# Patient Record
Sex: Male | Born: 1945 | Race: Black or African American | Hispanic: No | Marital: Married | Smoking: Former smoker
Health system: Southern US, Community
[De-identification: ages and names within clinical notes are randomized; demographics above are authoritative.]

## PROBLEM LIST (undated history)

## (undated) DIAGNOSIS — I10 Essential (primary) hypertension: Secondary | ICD-10-CM

## (undated) DIAGNOSIS — I5020 Unspecified systolic (congestive) heart failure: Secondary | ICD-10-CM

## (undated) DIAGNOSIS — I2699 Other pulmonary embolism without acute cor pulmonale: Secondary | ICD-10-CM

## (undated) DIAGNOSIS — E1140 Type 2 diabetes mellitus with diabetic neuropathy, unspecified: Secondary | ICD-10-CM

## (undated) DIAGNOSIS — C9140 Hairy cell leukemia not having achieved remission: Secondary | ICD-10-CM

## (undated) DIAGNOSIS — N183 Chronic kidney disease, stage 3 unspecified: Secondary | ICD-10-CM

## (undated) DIAGNOSIS — Z22322 Carrier or suspected carrier of Methicillin resistant Staphylococcus aureus: Secondary | ICD-10-CM

## (undated) DIAGNOSIS — Z9289 Personal history of other medical treatment: Secondary | ICD-10-CM

## (undated) DIAGNOSIS — E785 Hyperlipidemia, unspecified: Secondary | ICD-10-CM

## (undated) DIAGNOSIS — I4729 Other ventricular tachycardia: Secondary | ICD-10-CM

## (undated) DIAGNOSIS — D638 Anemia in other chronic diseases classified elsewhere: Secondary | ICD-10-CM

## (undated) DIAGNOSIS — I472 Ventricular tachycardia: Secondary | ICD-10-CM

## (undated) DIAGNOSIS — R9431 Abnormal electrocardiogram [ECG] [EKG]: Secondary | ICD-10-CM

## (undated) DIAGNOSIS — E118 Type 2 diabetes mellitus with unspecified complications: Secondary | ICD-10-CM

## (undated) DIAGNOSIS — E11319 Type 2 diabetes mellitus with unspecified diabetic retinopathy without macular edema: Secondary | ICD-10-CM

---

## 2011-05-13 DIAGNOSIS — I2699 Other pulmonary embolism without acute cor pulmonale: Secondary | ICD-10-CM

## 2011-05-13 HISTORY — DX: Other pulmonary embolism without acute cor pulmonale: I26.99

## 2018-01-24 ENCOUNTER — Emergency Department (HOSPITAL_COMMUNITY): Payer: Medicare HMO

## 2018-01-24 ENCOUNTER — Inpatient Hospital Stay (HOSPITAL_COMMUNITY): Payer: Medicare HMO

## 2018-01-24 ENCOUNTER — Encounter (HOSPITAL_COMMUNITY): Payer: Self-pay

## 2018-01-24 ENCOUNTER — Inpatient Hospital Stay (HOSPITAL_COMMUNITY)
Admission: EM | Admit: 2018-01-24 | Discharge: 2018-02-09 | DRG: 308 | Disposition: E | Payer: Medicare HMO | Attending: Internal Medicine | Admitting: Internal Medicine

## 2018-01-24 DIAGNOSIS — G4733 Obstructive sleep apnea (adult) (pediatric): Secondary | ICD-10-CM | POA: Diagnosis present

## 2018-01-24 DIAGNOSIS — Z856 Personal history of leukemia: Secondary | ICD-10-CM

## 2018-01-24 DIAGNOSIS — I420 Dilated cardiomyopathy: Secondary | ICD-10-CM | POA: Diagnosis present

## 2018-01-24 DIAGNOSIS — G931 Anoxic brain damage, not elsewhere classified: Secondary | ICD-10-CM | POA: Diagnosis present

## 2018-01-24 DIAGNOSIS — E11319 Type 2 diabetes mellitus with unspecified diabetic retinopathy without macular edema: Secondary | ICD-10-CM | POA: Diagnosis present

## 2018-01-24 DIAGNOSIS — E785 Hyperlipidemia, unspecified: Secondary | ICD-10-CM | POA: Diagnosis present

## 2018-01-24 DIAGNOSIS — I071 Rheumatic tricuspid insufficiency: Secondary | ICD-10-CM | POA: Diagnosis present

## 2018-01-24 DIAGNOSIS — E872 Acidosis: Secondary | ICD-10-CM | POA: Diagnosis present

## 2018-01-24 DIAGNOSIS — Z452 Encounter for adjustment and management of vascular access device: Secondary | ICD-10-CM

## 2018-01-24 DIAGNOSIS — J9600 Acute respiratory failure, unspecified whether with hypoxia or hypercapnia: Secondary | ICD-10-CM

## 2018-01-24 DIAGNOSIS — R34 Anuria and oliguria: Secondary | ICD-10-CM | POA: Diagnosis not present

## 2018-01-24 DIAGNOSIS — R569 Unspecified convulsions: Secondary | ICD-10-CM | POA: Diagnosis present

## 2018-01-24 DIAGNOSIS — I472 Ventricular tachycardia: Secondary | ICD-10-CM | POA: Diagnosis not present

## 2018-01-24 DIAGNOSIS — R402212 Coma scale, best verbal response, none, at arrival to emergency department: Secondary | ICD-10-CM | POA: Diagnosis present

## 2018-01-24 DIAGNOSIS — Z87891 Personal history of nicotine dependence: Secondary | ICD-10-CM

## 2018-01-24 DIAGNOSIS — R570 Cardiogenic shock: Secondary | ICD-10-CM | POA: Diagnosis present

## 2018-01-24 DIAGNOSIS — J9811 Atelectasis: Secondary | ICD-10-CM | POA: Diagnosis present

## 2018-01-24 DIAGNOSIS — Z23 Encounter for immunization: Secondary | ICD-10-CM | POA: Diagnosis present

## 2018-01-24 DIAGNOSIS — Z8614 Personal history of Methicillin resistant Staphylococcus aureus infection: Secondary | ICD-10-CM

## 2018-01-24 DIAGNOSIS — R402312 Coma scale, best motor response, none, at arrival to emergency department: Secondary | ICD-10-CM | POA: Diagnosis present

## 2018-01-24 DIAGNOSIS — I428 Other cardiomyopathies: Secondary | ICD-10-CM

## 2018-01-24 DIAGNOSIS — N179 Acute kidney failure, unspecified: Secondary | ICD-10-CM | POA: Diagnosis present

## 2018-01-24 DIAGNOSIS — R402112 Coma scale, eyes open, never, at arrival to emergency department: Secondary | ICD-10-CM | POA: Diagnosis present

## 2018-01-24 DIAGNOSIS — E1165 Type 2 diabetes mellitus with hyperglycemia: Secondary | ICD-10-CM | POA: Diagnosis present

## 2018-01-24 DIAGNOSIS — Z515 Encounter for palliative care: Secondary | ICD-10-CM | POA: Diagnosis not present

## 2018-01-24 DIAGNOSIS — D638 Anemia in other chronic diseases classified elsewhere: Secondary | ICD-10-CM | POA: Diagnosis present

## 2018-01-24 DIAGNOSIS — Z66 Do not resuscitate: Secondary | ICD-10-CM | POA: Diagnosis not present

## 2018-01-24 DIAGNOSIS — I130 Hypertensive heart and chronic kidney disease with heart failure and stage 1 through stage 4 chronic kidney disease, or unspecified chronic kidney disease: Secondary | ICD-10-CM | POA: Diagnosis present

## 2018-01-24 DIAGNOSIS — D696 Thrombocytopenia, unspecified: Secondary | ICD-10-CM | POA: Diagnosis present

## 2018-01-24 DIAGNOSIS — N183 Chronic kidney disease, stage 3 (moderate): Secondary | ICD-10-CM | POA: Diagnosis present

## 2018-01-24 DIAGNOSIS — Z4659 Encounter for fitting and adjustment of other gastrointestinal appliance and device: Secondary | ICD-10-CM

## 2018-01-24 DIAGNOSIS — I4901 Ventricular fibrillation: Principal | ICD-10-CM | POA: Diagnosis present

## 2018-01-24 DIAGNOSIS — I5022 Chronic systolic (congestive) heart failure: Secondary | ICD-10-CM | POA: Diagnosis present

## 2018-01-24 DIAGNOSIS — J9690 Respiratory failure, unspecified, unspecified whether with hypoxia or hypercapnia: Secondary | ICD-10-CM

## 2018-01-24 DIAGNOSIS — J9601 Acute respiratory failure with hypoxia: Secondary | ICD-10-CM | POA: Diagnosis present

## 2018-01-24 DIAGNOSIS — E1122 Type 2 diabetes mellitus with diabetic chronic kidney disease: Secondary | ICD-10-CM | POA: Diagnosis present

## 2018-01-24 DIAGNOSIS — E875 Hyperkalemia: Secondary | ICD-10-CM | POA: Diagnosis present

## 2018-01-24 DIAGNOSIS — I469 Cardiac arrest, cause unspecified: Secondary | ICD-10-CM

## 2018-01-24 DIAGNOSIS — I361 Nonrheumatic tricuspid (valve) insufficiency: Secondary | ICD-10-CM | POA: Diagnosis not present

## 2018-01-24 DIAGNOSIS — Z86711 Personal history of pulmonary embolism: Secondary | ICD-10-CM

## 2018-01-24 DIAGNOSIS — E1140 Type 2 diabetes mellitus with diabetic neuropathy, unspecified: Secondary | ICD-10-CM | POA: Diagnosis present

## 2018-01-24 HISTORY — DX: Hyperlipidemia, unspecified: E78.5

## 2018-01-24 HISTORY — DX: Type 2 diabetes mellitus with unspecified complications: E11.8

## 2018-01-24 HISTORY — DX: Hairy cell leukemia not having achieved remission: C91.40

## 2018-01-24 HISTORY — DX: Other ventricular tachycardia: I47.29

## 2018-01-24 HISTORY — DX: Carrier or suspected carrier of methicillin resistant Staphylococcus aureus: Z22.322

## 2018-01-24 HISTORY — DX: Essential (primary) hypertension: I10

## 2018-01-24 HISTORY — DX: Anemia in other chronic diseases classified elsewhere: D63.8

## 2018-01-24 HISTORY — DX: Abnormal electrocardiogram (ECG) (EKG): R94.31

## 2018-01-24 HISTORY — DX: Chronic kidney disease, stage 3 (moderate): N18.3

## 2018-01-24 HISTORY — DX: Other pulmonary embolism without acute cor pulmonale: I26.99

## 2018-01-24 HISTORY — DX: Type 2 diabetes mellitus with diabetic neuropathy, unspecified: E11.40

## 2018-01-24 HISTORY — DX: Ventricular tachycardia: I47.2

## 2018-01-24 HISTORY — DX: Type 2 diabetes mellitus with unspecified diabetic retinopathy without macular edema: E11.319

## 2018-01-24 HISTORY — DX: Unspecified systolic (congestive) heart failure: I50.20

## 2018-01-24 HISTORY — DX: Chronic kidney disease, stage 3 unspecified: N18.30

## 2018-01-24 HISTORY — DX: Personal history of other medical treatment: Z92.89

## 2018-01-24 LAB — I-STAT VENOUS BLOOD GAS, ED
Acid-base deficit: 10 mmol/L — ABNORMAL HIGH (ref 0.0–2.0)
Bicarbonate: 17.9 mmol/L — ABNORMAL LOW (ref 20.0–28.0)
O2 Saturation: 54 %
PCO2 VEN: 46.9 mmHg (ref 44.0–60.0)
PH VEN: 7.189 — AB (ref 7.250–7.430)
PO2 VEN: 35 mmHg (ref 32.0–45.0)
TCO2: 19 mmol/L — ABNORMAL LOW (ref 22–32)

## 2018-01-24 LAB — I-STAT CHEM 8, ED
BUN: 44 mg/dL — AB (ref 8–23)
Calcium, Ion: 0.86 mmol/L — CL (ref 1.15–1.40)
Chloride: 111 mmol/L (ref 98–111)
Creatinine, Ser: 1.7 mg/dL — ABNORMAL HIGH (ref 0.61–1.24)
Glucose, Bld: 109 mg/dL — ABNORMAL HIGH (ref 70–99)
HEMATOCRIT: 31 % — AB (ref 39.0–52.0)
HEMOGLOBIN: 10.5 g/dL — AB (ref 13.0–17.0)
Potassium: 3.7 mmol/L (ref 3.5–5.1)
SODIUM: 140 mmol/L (ref 135–145)
TCO2: 19 mmol/L — AB (ref 22–32)

## 2018-01-24 LAB — POCT I-STAT, CHEM 8
BUN: 40 mg/dL — AB (ref 8–23)
BUN: 40 mg/dL — ABNORMAL HIGH (ref 8–23)
CALCIUM ION: 1.05 mmol/L — AB (ref 1.15–1.40)
CHLORIDE: 105 mmol/L (ref 98–111)
CHLORIDE: 104 mmol/L (ref 98–111)
CREATININE: 2.3 mg/dL — AB (ref 0.61–1.24)
CREATININE: 2.4 mg/dL — AB (ref 0.61–1.24)
Calcium, Ion: 1.06 mmol/L — ABNORMAL LOW (ref 1.15–1.40)
GLUCOSE: 210 mg/dL — AB (ref 70–99)
GLUCOSE: 209 mg/dL — AB (ref 70–99)
HCT: 36 % — ABNORMAL LOW (ref 39.0–52.0)
HCT: 34 % — ABNORMAL LOW (ref 39.0–52.0)
Hemoglobin: 12.2 g/dL — ABNORMAL LOW (ref 13.0–17.0)
Hemoglobin: 11.6 g/dL — ABNORMAL LOW (ref 13.0–17.0)
POTASSIUM: 3.9 mmol/L (ref 3.5–5.1)
Potassium: 3 mmol/L — ABNORMAL LOW (ref 3.5–5.1)
Sodium: 140 mmol/L (ref 135–145)
Sodium: 140 mmol/L (ref 135–145)
TCO2: 21 mmol/L — ABNORMAL LOW (ref 22–32)
TCO2: 21 mmol/L — ABNORMAL LOW (ref 22–32)

## 2018-01-24 LAB — COMPREHENSIVE METABOLIC PANEL
ALK PHOS: 135 U/L — AB (ref 38–126)
ALT: 17 U/L (ref 0–44)
AST: 26 U/L (ref 15–41)
Albumin: 2 g/dL — ABNORMAL LOW (ref 3.5–5.0)
Anion gap: 14 (ref 5–15)
BUN: 35 mg/dL — AB (ref 8–23)
CALCIUM: 6.7 mg/dL — AB (ref 8.9–10.3)
CHLORIDE: 111 mmol/L (ref 98–111)
CO2: 15 mmol/L — AB (ref 22–32)
CREATININE: 1.69 mg/dL — AB (ref 0.61–1.24)
GFR calc non Af Amer: 39 mL/min — ABNORMAL LOW (ref >60)
GFR, EST AFRICAN AMERICAN: 45 mL/min — AB (ref >60)
Glucose, Bld: 110 mg/dL — ABNORMAL HIGH (ref 70–99)
Potassium: 3.8 mmol/L (ref 3.5–5.1)
SODIUM: 140 mmol/L (ref 135–145)
Total Bilirubin: 1.1 mg/dL (ref 0.3–1.2)
Total Protein: 5.5 g/dL — ABNORMAL LOW (ref 6.5–8.1)

## 2018-01-24 LAB — TROPONIN I
TROPONIN I: 0.29 ng/mL — AB (ref <0.03)
Troponin I: 0.03 ng/mL (ref <0.03)

## 2018-01-24 LAB — CBC WITH DIFFERENTIAL/PLATELET
BASOS PCT: 0 %
Basophils Absolute: 0 10*3/uL (ref 0.0–0.1)
Eosinophils Absolute: 0 10*3/uL (ref 0.0–0.7)
Eosinophils Relative: 0 %
HCT: 36.6 % — ABNORMAL LOW (ref 39.0–52.0)
HEMOGLOBIN: 11.1 g/dL — AB (ref 13.0–17.0)
LYMPHS PCT: 81 %
Lymphs Abs: 5.7 10*3/uL — ABNORMAL HIGH (ref 0.7–4.0)
MCH: 25.7 pg — ABNORMAL LOW (ref 26.0–34.0)
MCHC: 30.3 g/dL (ref 30.0–36.0)
MCV: 84.7 fL (ref 78.0–100.0)
MONOS PCT: 3 %
Monocytes Absolute: 0.2 10*3/uL (ref 0.1–1.0)
NEUTROS PCT: 16 %
Neutro Abs: 1.1 10*3/uL — ABNORMAL LOW (ref 1.7–7.7)
Platelets: 131 10*3/uL — ABNORMAL LOW (ref 150–400)
RBC: 4.32 MIL/uL (ref 4.22–5.81)
RDW: 16.6 % — ABNORMAL HIGH (ref 11.5–15.5)
WBC: 7 10*3/uL (ref 4.0–10.5)

## 2018-01-24 LAB — POCT I-STAT 3, ART BLOOD GAS (G3+)
ACID-BASE DEFICIT: 4 mmol/L — AB (ref 0.0–2.0)
Bicarbonate: 19.7 mmol/L — ABNORMAL LOW (ref 20.0–28.0)
O2 Saturation: 100 %
PH ART: 7.482 — AB (ref 7.350–7.450)
TCO2: 21 mmol/L — AB (ref 22–32)
pCO2 arterial: 25.2 mmHg — ABNORMAL LOW (ref 32.0–48.0)
pO2, Arterial: 169 mmHg — ABNORMAL HIGH (ref 83.0–108.0)

## 2018-01-24 LAB — I-STAT ARTERIAL BLOOD GAS, ED
Acid-base deficit: 2 mmol/L (ref 0.0–2.0)
Bicarbonate: 23.1 mmol/L (ref 20.0–28.0)
O2 SAT: 100 %
PCO2 ART: 40.1 mmHg (ref 32.0–48.0)
PO2 ART: 527 mmHg — AB (ref 83.0–108.0)
Patient temperature: 97.4
TCO2: 24 mmol/L (ref 22–32)
pH, Arterial: 7.364 (ref 7.350–7.450)

## 2018-01-24 LAB — RAPID URINE DRUG SCREEN, HOSP PERFORMED
AMPHETAMINES: NOT DETECTED
BARBITURATES: NOT DETECTED
BENZODIAZEPINES: NOT DETECTED
COCAINE: NOT DETECTED
Opiates: NOT DETECTED
Tetrahydrocannabinol: NOT DETECTED

## 2018-01-24 LAB — LIPID PANEL
CHOL/HDL RATIO: 3.1 ratio
CHOLESTEROL: 107 mg/dL (ref 0–200)
HDL: 34 mg/dL — ABNORMAL LOW (ref >40)
LDL Cholesterol: 59 mg/dL (ref 0–99)
Triglycerides: 72 mg/dL (ref <150)
VLDL: 14 mg/dL (ref 0–40)

## 2018-01-24 LAB — PROTIME-INR
INR: 1.45
INR: 1.54
PROTHROMBIN TIME: 18.4 s — AB (ref 11.4–15.2)
Prothrombin Time: 17.5 seconds — ABNORMAL HIGH (ref 11.4–15.2)

## 2018-01-24 LAB — CBG MONITORING, ED: Glucose-Capillary: 96 mg/dL (ref 70–99)

## 2018-01-24 LAB — I-STAT CG4 LACTIC ACID, ED
LACTIC ACID, VENOUS: 3.99 mmol/L — AB (ref 0.5–1.9)
Lactic Acid, Venous: 4.37 mmol/L (ref 0.5–1.9)

## 2018-01-24 LAB — I-STAT TROPONIN, ED: TROPONIN I, POC: 0.01 ng/mL (ref 0.00–0.08)

## 2018-01-24 LAB — GLUCOSE, CAPILLARY
GLUCOSE-CAPILLARY: 188 mg/dL — AB (ref 70–99)
GLUCOSE-CAPILLARY: 191 mg/dL — AB (ref 70–99)
GLUCOSE-CAPILLARY: 213 mg/dL — AB (ref 70–99)
Glucose-Capillary: 200 mg/dL — ABNORMAL HIGH (ref 70–99)
Glucose-Capillary: 201 mg/dL — ABNORMAL HIGH (ref 70–99)

## 2018-01-24 LAB — MAGNESIUM: MAGNESIUM: 1.7 mg/dL (ref 1.7–2.4)

## 2018-01-24 LAB — APTT: APTT: 27 s (ref 24–36)

## 2018-01-24 LAB — ETHANOL: Alcohol, Ethyl (B): <10 mg/dL (ref <10)

## 2018-01-24 MED ORDER — ASPIRIN 300 MG RE SUPP: 300.0000 mg | RECTAL | Status: AC

## 2018-01-24 MED ORDER — MIDAZOLAM HCL 2 MG/2ML IJ SOLN: 1.0000 mg | Freq: Once | INTRAMUSCULAR | Status: DC

## 2018-01-24 MED ORDER — INSULIN ASPART 100 UNIT/ML ~~LOC~~ SOLN
0.0000 [IU] | SUBCUTANEOUS | Status: DC
  Administered 2018-01-24: 2 [IU] via SUBCUTANEOUS

## 2018-01-24 MED ORDER — AMIODARONE HCL IN DEXTROSE 360-4.14 MG/200ML-% IV SOLN
30.0000 mg/h | INTRAVENOUS | Status: DC
  Administered 2018-01-24 – 2018-01-26 (×4): 30 mg/h via INTRAVENOUS
  Filled 2018-01-24 (×3): qty 200

## 2018-01-24 MED ORDER — ARTIFICIAL TEARS OPHTHALMIC OINT
1.0000 "application " | TOPICAL_OINTMENT | Freq: Three times a day (TID) | OPHTHALMIC | Status: DC
  Administered 2018-01-24 – 2018-01-28 (×9): 1 via OPHTHALMIC
  Filled 2018-01-24: qty 3.5

## 2018-01-24 MED ORDER — CHLORHEXIDINE GLUCONATE 0.12% ORAL RINSE (MEDLINE KIT)
15.0000 mL | Freq: Two times a day (BID) | OROMUCOSAL | Status: DC
  Administered 2018-01-24 – 2018-01-31 (×15): 15 mL via OROMUCOSAL

## 2018-01-24 MED ORDER — SODIUM CHLORIDE 0.9 % IV SOLN: INTRAVENOUS | Status: DC | PRN

## 2018-01-24 MED ORDER — FENTANYL 2500MCG IN NS 250ML (10MCG/ML) PREMIX INFUSION
100.0000 ug/h | INTRAVENOUS | Status: DC
  Administered 2018-01-24: 100 ug/h via INTRAVENOUS
  Administered 2018-01-25 – 2018-01-26 (×3): 250 ug/h via INTRAVENOUS
  Administered 2018-01-26: 150 ug/h via INTRAVENOUS
  Administered 2018-01-27: 100 ug/h via INTRAVENOUS
  Filled 2018-01-24 (×7): qty 250

## 2018-01-24 MED ORDER — AMIODARONE LOAD VIA INFUSION
150.0000 mg | Freq: Once | INTRAVENOUS | Status: AC
  Administered 2018-01-24: 150 mg via INTRAVENOUS
  Filled 2018-01-24: qty 83.34

## 2018-01-24 MED ORDER — EPINEPHRINE PF 1 MG/ML IJ SOLN
0.5000 ug/min | INTRAVENOUS | Status: DC
  Administered 2018-01-24: 0.5 ug/min via INTRAVENOUS
  Filled 2018-01-24: qty 4

## 2018-01-24 MED ORDER — NOREPINEPHRINE 4 MG/250ML-% IV SOLN: 0.0000 ug/min | INTRAVENOUS | Status: DC

## 2018-01-24 MED ORDER — ORAL CARE MOUTH RINSE
15.0000 mL | OROMUCOSAL | Status: DC
  Administered 2018-01-24 – 2018-02-01 (×73): 15 mL via OROMUCOSAL

## 2018-01-24 MED ORDER — POTASSIUM CHLORIDE 10 MEQ/100ML IV SOLN
10.0000 meq | INTRAVENOUS | Status: AC
  Administered 2018-01-24 – 2018-01-25 (×4): 10 meq via INTRAVENOUS
  Filled 2018-01-24 (×4): qty 100

## 2018-01-24 MED ORDER — SODIUM CHLORIDE 0.9 % IV SOLN
INTRAVENOUS | Status: DC
  Administered 2018-01-24 – 2018-01-29 (×4): via INTRAVENOUS

## 2018-01-24 MED ORDER — SODIUM CHLORIDE 0.9 % IV SOLN
1.0000 ug/kg/min | INTRAVENOUS | Status: DC
  Administered 2018-01-24 – 2018-01-25 (×2): 1 ug/kg/min via INTRAVENOUS
  Filled 2018-01-24 (×2): qty 20

## 2018-01-24 MED ORDER — FAMOTIDINE 40 MG/5ML PO SUSR
20.0000 mg | Freq: Two times a day (BID) | ORAL | Status: DC
  Administered 2018-01-24 – 2018-01-26 (×4): 20 mg
  Filled 2018-01-24 (×5): qty 2.5

## 2018-01-24 MED ORDER — FENTANYL CITRATE (PF) 100 MCG/2ML IJ SOLN: 50.0000 ug | Freq: Once | INTRAMUSCULAR | Status: DC

## 2018-01-24 MED ORDER — SODIUM BICARBONATE 8.4 % IV SOLN
INTRAVENOUS | Status: AC | PRN
  Administered 2018-01-24: 50 meq via INTRAVENOUS

## 2018-01-24 MED ORDER — HEPARIN SODIUM (PORCINE) 5000 UNIT/ML IJ SOLN
5000.0000 [IU] | Freq: Three times a day (TID) | INTRAMUSCULAR | Status: DC
  Administered 2018-01-25 – 2018-02-01 (×22): 5000 [IU] via SUBCUTANEOUS
  Filled 2018-01-24 (×22): qty 1

## 2018-01-24 MED ORDER — CISATRACURIUM BOLUS VIA INFUSION
0.0500 mg/kg | INTRAVENOUS | Status: DC | PRN
  Filled 2018-01-24: qty 5

## 2018-01-24 MED ORDER — FENTANYL BOLUS VIA INFUSION
25.0000 ug | INTRAVENOUS | Status: DC | PRN
  Filled 2018-01-24: qty 25

## 2018-01-24 MED ORDER — CISATRACURIUM BOLUS VIA INFUSION
0.1000 mg/kg | Freq: Once | INTRAVENOUS | Status: AC
  Administered 2018-01-24: 9.2 mg via INTRAVENOUS
  Filled 2018-01-24: qty 10

## 2018-01-24 MED ORDER — MAGNESIUM SULFATE IN D5W 1-5 GM/100ML-% IV SOLN
1.0000 g | Freq: Once | INTRAVENOUS | Status: AC
  Administered 2018-01-24: 1 g via INTRAVENOUS
  Filled 2018-01-24: qty 100

## 2018-01-24 MED ORDER — SODIUM CHLORIDE 0.9 % IV SOLN
INTRAVENOUS | Status: DC
  Administered 2018-01-24: 1.5 [IU]/h via INTRAVENOUS
  Filled 2018-01-24 (×3): qty 1

## 2018-01-24 MED ORDER — SODIUM CHLORIDE 0.9 % IV SOLN
INTRAVENOUS | Status: DC
  Administered 2018-01-24: 100 mL/h via INTRAVENOUS
  Administered 2018-01-25 – 2018-01-26 (×2): via INTRAVENOUS

## 2018-01-24 MED ORDER — MIDAZOLAM BOLUS VIA INFUSION
1.0000 mg | INTRAVENOUS | Status: DC | PRN
  Filled 2018-01-24 (×2): qty 1

## 2018-01-24 MED ORDER — SODIUM CHLORIDE 0.9 % IV SOLN
2.0000 mg/h | INTRAVENOUS | Status: DC
  Administered 2018-01-24: 2 mg/h via INTRAVENOUS
  Administered 2018-01-25 (×2): 3 mg/h via INTRAVENOUS
  Administered 2018-01-26 – 2018-01-27 (×3): 10 mg/h via INTRAVENOUS
  Filled 2018-01-24 (×6): qty 10

## 2018-01-24 MED ORDER — AMIODARONE HCL IN DEXTROSE 360-4.14 MG/200ML-% IV SOLN
60.0000 mg/h | INTRAVENOUS | Status: AC
  Administered 2018-01-24 (×2): 60 mg/h via INTRAVENOUS
  Filled 2018-01-24 (×2): qty 200

## 2018-01-24 MED ORDER — EPINEPHRINE PF 1 MG/10ML IJ SOSY
PREFILLED_SYRINGE | INTRAMUSCULAR | Status: AC | PRN
  Administered 2018-01-24: 1 mg via INTRAVENOUS

## 2018-01-24 NOTE — Progress Notes (Signed)
   01/15/2018 1600  Clinical Encounter Type  Visited With Family  Visit Type Initial;ED;Critical Care;Psychological support;Spiritual support  Referral From Nurse  Spiritual Encounters  Spiritual Needs Prayer;Emotional  [place] paged to support family; emotional and spiritual support offered along with prayer; family currently in [place]; [place] will escort family to [place] when patient transfers

## 2018-01-24 NOTE — H&P (Signed)
PULMONARY / CRITICAL CARE MEDICINE   NAME:  Andrew Lewis, MRN:  [id number], DOB:  1946-03-11, LOS: 0 ADMISSION DATE:  02/03/2018, CONSULTATION DATE:  02/07/2018 REFERRING MD:  A. Zenia Resides (ED) CHIEF COMPLAINT:  S/P V-fib arrest  HISTORY OF PRESENT ILLNESS   Andrew Lewis is a 72 y.o. B M brought to [facility] ED following a V-fib arrest. Per the patient's wife, the were en route back to their home in [place], [place] from [place] and stopped at [employer] in [place]. After eating and getting back into their car, the patient suddenly became unresponsive without a prior complaint of chest pain, SOB or noticeable lateralizing signs or symptoms. EMS was called, arrived within 15 min and found the patient apneic and pulseless in V-fib arrest. He received shock x 3, epi x 2 and arrived on Epi drip; Amiodarone 300mg  was also given prior to arrival. He reportedly had intermittent pulses during the 30 min transport to [facility] with approximate down time to initial ROSC of 7 min. ECG post arrival without evidence of STEMI and TnI was 0.03.  The wife states that the patient has a hx HTN, DM and an arrhythmia in the past, for which he was on anticoagulants. He does see a cardiologist in [place]. He also has a hx OSA and uses CPAP SIGNIFICANT PAST MEDICAL HISTORY   Hx of arrhythmia No hx seizures or syncope  STUDIES:   9/15 PCXR - cardiomegaly with a right pleural effusion 9/15 Head CT - pending CULTURES:  None  ANTIBIOTICS:  None  LINES/TUBES:   R&L periph IV OGT ETT Foley CONSULTANTS:  Seen by Cardiol in ED SUBJECTIVE:  Unresponsive  CONSTITUTIONAL: BP 122/84   Pulse (!) 119   Temp (!) 97.3 F (36.3 C)   Resp 17   Ht 5\' 9"  (1.753 m)   Wt 90.7 kg   SpO2 (!) 77%   BMI 29.53 kg/m   No intake/output data recorded.        PHYSICAL EXAM: General:  WD/WN BM intubated on vent Neuro: Sluggishly reactive pupils; +corneals; +gag; no response with plantar stimulation HEENT:  [place]/AT; fundi not well  visualized; TMs clear; ETT/OGT in place Cardiovascular:  Unable to hear heart sounds secondary to lung noises Lungs:  Bilateral rhonchi Abdomen:  Supple, no guarding; no discrete organomegaly, +BS Musculoskeletal:  No active joints Skin:  Tr edema  RESOLVED PROBLEM LIST   ASSESSMENT AND PLAN   1) S/P V-fib arrest with down time <45 min. No evidence AMI. Will implement TTM after head CT. 2) Hx OSA. No acute intervention required 3) HTN. Not a current issue as patient currently on pressors 4) Type II DM. SSI 5) R pleural effusion with cardiomegaly. Likely chronic OHD with ventricular dysfn. Will obtain ECHO. 6) Probable CKD with increased creat. Follow urine output, trend BMD and avoid nephrotoxic agents    Best Practice / Goals of Care / Disposition.   DVT PROPHYLAXIS:LMWH NUTRITION:NPO MOBILITY:BR GOALS OF CARE:TTM FAMILY DISCUSSIONS: D[place] in ED   LABS  Glucose Recent Labs  Lab 01/14/2018 1445  GLUCAP 96    BMET Recent Labs  Lab 01/25/2018 1447 01/16/2018 1457  NA 140 140  K 3.7 3.8  CL 111 111  CO2  --  15*  BUN 44* 35*  CREATININE 1.70* 1.69*  GLUCOSE 109* 110*    Liver Enzymes Recent Labs  Lab 02/02/2018 1457  AST 26  ALT 17  ALKPHOS 135*  BILITOT 1.1  ALBUMIN 2.0*    Electrolytes Recent Labs  Lab 01/30/2018 1457  CALCIUM 6.7*    CBC Recent Labs  Lab 01/10/2018 1447 01/13/2018 1457  WBC  --  7.0  HGB 10.5* 11.1*  HCT 31.0* 36.6*  PLT  --  131*    ABG No results for input(s): PHART, PCO2ART, PO2ART in the last 168 hours.  Coag's Recent Labs  Lab 02/03/2018 1505  APTT 27  INR 1.45    Sepsis Markers Recent Labs  Lab 01/29/2018 1455  LATICACIDVEN 4.37*    Cardiac Enzymes Recent Labs  Lab 02/06/2018 1457  TROPONINI 0.03*    PAST MEDICAL HISTORY :   He  has a past medical history of Diabetes mellitus without complication ([place]).  PAST SURGICAL HISTORY:  He  has no past surgical history on file.  Allergies  Allergen Reactions  .  Tape Other (See Comments)    Pt has keloid-pulls skinn off leaving bad scar    No current facility-administered medications on file prior to encounter.    No current outpatient medications on file prior to encounter.    FAMILY HISTORY:   His family history is not on file.  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

## 2018-01-24 NOTE — ED Provider Notes (Signed)
[facility] EMERGENCY DEPARTMENT Provider Note   CSN: [id number] Arrival date & time: 01/29/2018  1433     History   Chief Complaint No chief complaint on file.   HPI Arthor Gorter is a 72 y.o. male.  72 year old male presents via EMS after experience in cardiac arrest.  Patient was found unresponsive in a restaurant and EMS was called and he was in V. fib.  Patient ACLS protocol started patient and he had had intermittent return of spontaneous circulation.  Patient had been defibrillated as well as given amiodarone as well as placed on epinephrine drip.  He was intubated with a Combitube.  Patient lost pulses again just prior to arrival and presents on the external bumper     Past Medical History:  Diagnosis Date  . Diabetes mellitus without complication ([place])     There are no active problems to display for this patient.         Home Medications    Prior to Admission medications   Not on File    Family History No family history on file.  Social History Social History   Tobacco Use  . Smoking status: Not on file  Substance Use Topics  . Alcohol use: Not on file  . Drug use: Not on file     Allergies   Patient has no allergy information on record.   Review of Systems Review of Systems  Unable to perform ROS: Intubated     Physical Exam Updated Vital Signs BP (!) 152/106   Pulse (!) 119   Temp (!) 96.1 F (35.6 C) (Temporal)   Resp 14   Ht 1.753 m (5\' 9" )   Wt 90.7 kg   SpO2 (!) 77%   BMI 29.53 kg/m   Physical Exam  Constitutional: He appears well-developed and well-nourished. He has a sickly appearance. He is intubated.  HENT:  Head: Normocephalic and atraumatic.  Eyes: Pupils are equal, round, and reactive to light. Conjunctivae, EOM and lids are normal.  Neck: Normal range of motion. Neck supple. No tracheal deviation present. No thyroid mass present.  Cardiovascular: Normal rate, regular rhythm and normal heart  sounds. Exam reveals no gallop.  No murmur heard. Pulmonary/Chest: He is intubated. He has decreased breath sounds. He has rhonchi.  Abdominal: Soft. Normal appearance and bowel sounds are normal. He exhibits no distension. There is no tenderness. There is no rebound and no CVA tenderness.  Musculoskeletal: Normal range of motion. He exhibits no edema or tenderness.  Neurological: He is unresponsive. GCS eye subscore is 1. GCS verbal subscore is 1. GCS motor subscore is 1.  Skin: Skin is warm and dry. No abrasion and no rash noted.  Nursing note and vitals reviewed.    ED Treatments / Results  Labs (all labs ordered are listed, but only abnormal results are displayed) Labs Reviewed  I-STAT CHEM 8, ED - Abnormal; Notable for the following components:      Result Value   BUN 44 (*)    Creatinine, Ser 1.70 (*)    Glucose, Bld 109 (*)    Calcium, Ion 0.86 (*)    TCO2 19 (*)    Hemoglobin 10.5 (*)    HCT 31.0 (*)    All other components within normal limits  I-STAT CG4 LACTIC ACID, ED - Abnormal; Notable for the following components:   Lactic Acid, Venous 4.37 (*)    All other components within normal limits  I-STAT VENOUS BLOOD GAS, ED - Abnormal;  Notable for the following components:   pH, Ven 7.189 (*)    Bicarbonate 17.9 (*)    TCO2 19 (*)    Acid-base deficit 10.0 (*)    All other components within normal limits  CBC WITH DIFFERENTIAL/PLATELET  PROTIME-INR  APTT  COMPREHENSIVE METABOLIC PANEL  TROPONIN I  LIPID PANEL  RAPID URINE DRUG SCREEN, HOSP PERFORMED  ETHANOL  I-STAT TROPONIN, ED  CBG MONITORING, ED  I-STAT ARTERIAL BLOOD GAS, ED    EKG None  Radiology No results found.  Procedures Procedure Name: Intubation Date/Time: 02/07/2018 3:04 PM Performed by: Lacretia Leigh, MD Pre-anesthesia Checklist: Patient identified Preoxygenation: Pre-oxygenation with 100% oxygen Ventilation: Mask ventilation without difficulty Laryngoscope Size: Glidescope and  1 Grade View: Grade I Tube size: 7.5 mm Number of attempts: 1 Airway Equipment and Method: Video-laryngoscopy Placement Confirmation: ETT inserted through vocal cords under direct vision and Positive ETCO2 Secured at: 24 cm Tube secured with: ETT holder      (including critical care time)  Medications Ordered in ED Medications  0.9 %  sodium chloride infusion (has no administration in time range)  EPINEPHrine (ADRENALIN) 4 mg in dextrose 5 % 250 mL (0.016 mg/mL) infusion (has no administration in time range)  EPINEPHrine (ADRENALIN) 1 MG/10ML injection (1 mg Intravenous Given 01/23/2018 1442)  sodium bicarbonate injection (50 mEq Intravenous Given 01/18/2018 1443)     Initial Impression / Assessment and Plan / ED Course  I have reviewed the triage vital signs and the nursing notes.  Pertinent labs & imaging results that were available during my care of the patient were reviewed by me and considered in my medical decision making (see chart for details).     Patient is ET tube changed here and placement has been confirmed.  Spoke with Dr. Harrell Gave from cardiology who has seen the patient.  EKG shows that patient may be in heart block.  Patient lost pulses transiently but were regained when patient given bicarbonate as well as epinephrine and restarted on his epinephrine drip.  Discussed with critical care will come and admit the patient.   CRITICAL CARE Performed by: Leota Jacobsen Total critical care time: 45 minutes Critical care time was exclusive of separately billable procedures and treating other patients. Critical care was necessary to treat or prevent imminent or life-threatening deterioration. Critical care was time spent personally by me on the following activities: development of treatment plan with patient and/or surrogate as well as nursing, discussions with consultants, evaluation of patient's response to treatment, examination of patient, obtaining history from  patient or surrogate, ordering and performing treatments and interventions, ordering and review of laboratory studies, ordering and review of radiographic studies, pulse oximetry and re-evaluation of patient's condition.   Final Clinical Impressions(s) / ED Diagnoses   Final diagnoses:  None    ED Discharge Orders    None       Lacretia Leigh, MD 01/14/2018 1511

## 2018-01-24 NOTE — Consult Note (Signed)
Cardiology Consultation:   Patient ID: [name]; [id number]; 1945-09-27   Admit date: 01/27/2018 Date of Consult: 01/30/2018  Primary Care Provider: System, Pcp Not In Primary Cardiologist: Dr. Ashby Dawes, MD [facility])   Patient Profile:   Andrew Lewis is a 72 y.o. 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 Zenia Resides.  History of Present Illness:   Mr. Carignan has been followed by [facility], being seen recently on 01/04/2018. Most recent ischemic evaluation available for review by nuclear stress testing in 2011 negative for ischemia. Prior echo in 2014 showed an EF of 20-25%, dilated LV and RV, severely reduced RVSF, severe biatrial enlargement, moderate MR, severe TR, mild AI, moderately elevated PASP. Echo on 01/21/2018 showed an EF of 10-15%, mild concentric LVH, Gr3DD, moderately dilated LV cavity, mildly enlarged RV with mildly reduced RVSF, severe biatrial enlargement, mild mitral annular calcification, moderate to severe MR/TR, moderately elevated PASP. He has been maintained on lisinopril, Lopressor, ASA, and Crestor.   Patient and family were en route from [place] back to [place], [place] when they stopped at [employer] to eat. Upon finishing eating and getting back to the car, the patient was found unresponsive by family member while sitting in his car, slumped over the steering wheel. EMS was called (arrived within 15 minutes) and he was placed on the monitor where VF was noted. He  received defibrillation x 3, EPi x 2, amiodarone 300 mg bolus, and arrived on an Epi gtt. King tube was placed in the field and has been been changed for a ET tube. Pulses were intermittent during transport. Approximate down time to ROSC was 7 minutes per notes. Initial I-stat troponin of 0.01. K+ 3.7, SCr 1.70, HGB 10.5, albumin 2.0, Alk phos 135, TnI 0.03, UDS negative, ethanol < 10, CXR with right pleural effusion with basilar atelectasis. EKG with high-grade AV block, 65 bpm, left axis deviation, prior inferior infarct, lateral TWI (possibly old when looking at EKG read out from 01/04/2018 in [place]), no STEMI. He has been placed on hypothermic protocol in the ED. In the ED, he transiently lost pulses again, though regained pulse with sodium bicarb administration. Not currently on sedation. SBP in the 140s mmHg on Epi gtt.   Past Medical History:  Diagnosis Date  . Anemia of chronic disease   . Chronic kidney disease (CKD), stage III (moderate) ([facility])   . Diabetes mellitus with complication ([place])   . Diabetic neuropathy ([place])   . Diabetic retinopathy ([place])   . Hairy cell leukemia ([place])   . HFrEF (heart failure with reduced ejection fraction) ([place])    a. NICM with an EF of EF of 10-15%, mild concentric LVH, Gr3DD, moderately dilated LV cavity, mildly enlarged RV with mildly reduced RVSF, severe biatrial enlargement, mild mitral annular calcification, moderate to severe MR/TR, moderately elevated PASP; b. previously declined ICD  . History of nuclear stress test    a. 2011: negative for ischemia  . HLD (hyperlipidemia)   .  HTN (hypertension)   . MRSA (methicillin resistant staph aureus) culture positive   . NSVT (nonsustained ventricular tachycardia) ([place])   . Prolonged Q-T interval on ECG   . Pulmonary embolism ([place]) 2013   a. tx'd w/ Coumadin     Home Meds: Prior to Admission medications   Not on File    Inpatient Medications: Scheduled Meds:  Continuous Infusions: .  sodium chloride    . sodium chloride    . epinephrine 0.5 mcg/min (01/22/2018 1503)   PRN Meds: Place/Maintain arterial line **AND** sodium chloride  Allergies:   Allergies  Allergen Reactions  . Tape Other (See Comments)    Pt has keloid-pulls skinn off leaving bad scar    Social History:   Social History   Socioeconomic History  . Marital status: Married    Spouse name: Not on file  . Number of children: Not on file  . Years of education: Not on file  . Highest education level: Not on file  Occupational History  . Not on file  Social Needs  . Financial resource strain: Not on file  . Food insecurity:    Worry: Not on file    Inability: Not on file  . Transportation needs:    Medical: Not on file    Non-medical: Not on file  Tobacco Use  . Smoking status: Not on file  Substance and Sexual Activity  . Alcohol use: Not on file  . Drug use: Not on file  . Sexual activity: Not on file  Lifestyle  . Physical activity:    Days per week: Not on file    Minutes per session: Not on file  . Stress: Not on file  Relationships  . Social connections:    Talks on phone: Not on file    Gets together: Not on file    Attends religious service: Not on file    Active member of club or organization: Not on file    Attends meetings of clubs or organizations: Not on file    Relationship status: Not on file  . Intimate partner violence:    Fear of current or ex partner: Not on file    Emotionally abused: Not on file    Physically abused: Not on file    Forced sexual activity: Not on file  Other Topics Concern  . Not on file  Social History Narrative  . Not on file     Family History: No family history on file.  Unable to obtain; patient intubated  ROS:  Review of Systems  Unable to perform ROS: Intubated      Physical Exam/Data:   Vitals:   01/19/2018 1530 01/23/2018 1622 01/28/2018 1628 01/19/2018 1631  BP: 122/84 133/84 (!) 133/93   Pulse:      Resp: 17 15 16    Temp:  (!) 97.3 F (36.3 C) (!) 96.8 F (36 C) (!) 96.6 F (35.9 C)   TempSrc:      SpO2:      Weight:    91.8 kg  Height:       No intake or output data in the 24 hours ending 01/31/2018 1634 Filed Weights   01/18/2018 1455 01/13/2018 1631  Weight: 90.7 kg 91.8 kg   Body mass index is 29.87 kg/m.   Physical Exam: General: Critically ill appearing. Head: Normocephalic, atraumatic, sclera non-icteric, no xanthomas, nares without discharge.  Neck: Negative for carotid bruits. JVD difficult to assess given mechanical airway. Lungs: Diminished breath sounds bilaterally. Intubated.  Heart: RRR with S1 S2. III/VI systolic murmurs RUSB, II/VI systolic murmur at the apex, no rubs, or gallops appreciated. Abdomen: Soft, non-tender, non-distended with normoactive bowel sounds. No hepatomegaly. No rebound/guarding. No obvious abdominal masses. Msk:  Strength and tone appear normal for age. Extremities: No clubbing or cyanosis. 1+ bilateral edema with a chronic woody appearance.  Neuro: Intubated. Psych:  Intubated.   EKG:  The EKG was personally reviewed and demonstrates: high-grade AV block, 65 bpm, left axis deviation, prior inferior infarct, lateral TWI (possibly old when looking at EKG read out from 01/04/2018 in [place]), no STEMI Telemetry:  Telemetry was personally reviewed and demonstrates: sinus rhythm   Weights: Filed Weights   01/12/2018 1455 01/12/2018 1631  Weight: 90.7 kg 91.8 kg    Relevant CV Studies: Echo 01/21/2018: Summary  1. Overall left ventricular ejection fraction is estimated at 10 to 15%.  2. Severely decreased global left ventricular systolic function.  3. (Grade 3) Severely abnormal left ventricular diastolic filling.  4. Mild concentric left ventricular hypertrophy.  5. Moderately increased left ventricular internal cavity size.  6. Biplane EF: 17%.  7. Mildly enlarged right ventricle.  8. Mildly reduced RV systolic function.  9. Severely dilated left  atrium. 10. Severely dilated right atrium. 11. Mild mitral annular calcification. 12. Moderate to severe mitral valve regurgitation. 13. Moderate-severe tricuspid regurgitation. 14. Moderately elevated pulmonary artery systolic pressure.  Nuclear stress   Laboratory Data:  Chemistry Recent Labs  Lab 01/30/2018 1447 02/08/2018 1457  NA 140 140  K 3.7 3.8  CL 111 111  CO2  --  15*  GLUCOSE 109* 110*  BUN 44* 35*  CREATININE 1.70* 1.69*  CALCIUM  --  6.7*  GFRNONAA  --  39*  GFRAA  --  45*  ANIONGAP  --  14    Recent Labs  Lab 01/26/2018 1457  PROT 5.5*  ALBUMIN 2.0*  AST 26  ALT 17  ALKPHOS 135*  BILITOT 1.1   Hematology Recent Labs  Lab 01/25/2018 1447 01/18/2018 1457  WBC  --  7.0  RBC  --  4.32  HGB 10.5* 11.1*  HCT 31.0* 36.6*  MCV  --  84.7  MCH  --  25.7*  MCHC  --  30.3  RDW  --  16.6*  PLT  --  131*   Cardiac Enzymes Recent Labs  Lab 02/08/2018 1457  TROPONINI 0.03*    Recent Labs  Lab 01/22/2018 1446  TROPIPOC 0.01    BNPNo results for input(s): BNP, PROBNP in the last 168 hours.  DDimer No results for input(s): DDIMER in the last 168 hours.  Radiology/Studies:  Dg Chest Portable 1 View  Result Date: 01/12/2018 IMPRESSION: Tubes and lines in satisfactory position. Right pleural effusion with basilar atelectasis. Electronically Signed   By: Inez Catalina M.D.   On: 02/05/2018 15:14    Assessment and Plan:   1. Cardiac arrest secondary to VF/Code ICE: -Hypothermic protocol per PCCM -Intubated  -Remains on Epi gtt, wean as able -Check echo -Start IV amiodarone infusion with bolus  -Add/escalate beta blocker as able -Check magnesium with recommendation to replete to goal > 2.0 -Potassium 3.8 upon arrival to the ED, recommend repletion to goal > 4.0 -Check TSH -No plans for emergent LHC given lack of ST elevation on 12-lead per MD -Monitor for meaningful recovery  -Will need EP evaluation for ICD if there is meaningful recovery   2. Elevated  troponin: -Initial troponin 0.03 -Continue to cycle until peak -  Stat echo -No indication for heparin gtt at this time unless there is dynamic troponin elevation   3. HFrEF secondary to NICM/dilated cardiomyopathy: -Recent echo 01/21/2018 as above -Has previously declined consideration for ICD -Escalate evidence-based heart failure therapy as able -Will need EP input for ICD if there is meaningful recovery   4. CKD stage III: -Appears stable -Monitor   5. History of PE: -2013 -Status post Coumadin    For questions or updates, please contact [facility] Please consult [email or web address] for contact info under Cardiology/STEMI.   Signed, Christell Faith, PA-C [place] Pager: [phone] 01/15/2018, 4:34 PM

## 2018-01-24 NOTE — ED Notes (Signed)
Paged Critical Care to RN Clarise Cruz

## 2018-01-24 NOTE — ED Notes (Signed)
Verbal order to page PCCM From Dr. Zenia Resides

## 2018-01-24 NOTE — ED Notes (Signed)
Pt to CT then 2H

## 2018-01-24 NOTE — ED Triage Notes (Addendum)
Patient arrived by [facility] following cardiac arrest while sitting in car. Family found patient slumped over steering wheel apneic and pulseless. Placed on monitor and found in Fib. Received shock x 3, epi x 2 and arrived on Epi drip. Amio 300mg  given pta. Arrived with [facility] airway and was on lucas as arriving into treatment room. Return of pulses once moved to stretcher. Pulses on/off during transport 30 minutes-approximate initial down time to ROSC 7 minutes. Epi drip at 26mcg

## 2018-01-24 NOTE — ED Notes (Signed)
All belongings given to family in consultation room; necklace remains on patient

## 2018-01-24 NOTE — ED Notes (Addendum)
Call placed to [facility] for STAT CT head and requesting central line

## 2018-01-24 NOTE — Procedures (Signed)
Central Venous Catheter Insertion Procedure Note Frederico Gerling [id number] [birth year]  Procedure: Insertion of Central Venous Catheter Indications: Assessment of intravascular volume, Drug and/or fluid administration and Frequent blood sampling  Procedure Details Consent: Risks of procedure as well as the alternatives and risks of each were explained to the (patient/caregiver).  Consent for procedure obtained. Time Out: Verified patient identification, verified procedure, site/side was marked, verified correct patient position, special equipment/implants available, medications/allergies/relevent history reviewed, required imaging and test results available.  Performed  Maximum sterile technique was used including antiseptics, cap, gloves, gown, hand hygiene, mask and sheet. Skin prep: Chlorhexidine; local anesthetic administered A antimicrobial bonded/coated triple lumen catheter was placed in the left internal jugular vein using the Seldinger technique.  Evaluation Blood flow good Complications: No apparent complications Patient did tolerate procedure well. Chest X-ray ordered to verify placement.  CXR: normal.  Hayden Pedro, AGACNP-BC [facility]  Pgr: [phone]  PCCM Pgr: [phone]

## 2018-01-24 NOTE — Progress Notes (Signed)
[facility] Progress Note Patient Name: Andrew Lewis DOB: 1946-04-28 MRN: [id number]   Date of Service  01/28/2018  HPI/Events of Note  Multiple issues: 1. Blood glucose = 191, 2. Mg++ = 1.7, K+ = 3.0 and Creatinine = 2.4 and 3. Nursing request for CV.  eICU Interventions  Will order: 1. Q 4 hour sensitive Novolog SSI. 2. Replace Mg++ and K+.  3. Will notify ground team of CVL request.      Intervention Category Major Interventions: Electrolyte abnormality - evaluation and management;Hyperglycemia - active titration of insulin therapy  Lysle Dingwall 01/15/2018, 8:26 PM

## 2018-01-24 NOTE — Progress Notes (Signed)
RT note: RT and RN transported patient on ventilator to CT and then to [facility]. Vital signs stable through out. RT called report to [place] RT.

## 2018-01-24 NOTE — Procedures (Signed)
Arterial Catheter Insertion Procedure Note Andrew Lewis [id number] Oct 17, 1945  Procedure: Insertion of Arterial Catheter  Indications: Frequent blood sampling  Procedure Details Consent: Unable to obtain consent because of emergent medical necessity. Time Out: Verified patient identification, verified procedure, site/side was marked, verified correct patient position, special equipment/implants available, medications/allergies/relevent history reviewed, required imaging and test results available.  Performed  Maximum sterile technique was used including antiseptics, cap, gloves, gown, hand hygiene, mask and sheet. Skin prep: Chlorhexidine; local anesthetic administered 20 gauge catheter was inserted into right radial artery using the Seldinger technique. ULTRASOUND GUIDANCE USED: YES Evaluation Blood flow good; BP tracing good. Complications: No apparent complications.   Dimple Nanas 02/02/2018

## 2018-01-25 ENCOUNTER — Encounter (HOSPITAL_COMMUNITY): Payer: Self-pay

## 2018-01-25 ENCOUNTER — Inpatient Hospital Stay (HOSPITAL_COMMUNITY): Payer: Medicare HMO

## 2018-01-25 ENCOUNTER — Other Ambulatory Visit: Payer: Self-pay

## 2018-01-25 DIAGNOSIS — I472 Ventricular tachycardia: Secondary | ICD-10-CM

## 2018-01-25 DIAGNOSIS — I361 Nonrheumatic tricuspid (valve) insufficiency: Secondary | ICD-10-CM

## 2018-01-25 LAB — BASIC METABOLIC PANEL
Anion gap: 13 (ref 5–15)
Anion gap: 12 (ref 5–15)
Anion gap: 14 (ref 5–15)
Anion gap: 14 (ref 5–15)
Anion gap: 15 (ref 5–15)
BUN: 44 mg/dL — AB (ref 8–23)
BUN: 44 mg/dL — ABNORMAL HIGH (ref 8–23)
BUN: 45 mg/dL — ABNORMAL HIGH (ref 8–23)
BUN: 45 mg/dL — ABNORMAL HIGH (ref 8–23)
BUN: 47 mg/dL — ABNORMAL HIGH (ref 8–23)
CALCIUM: 7.8 mg/dL — AB (ref 8.9–10.3)
CALCIUM: 7.7 mg/dL — AB (ref 8.9–10.3)
CO2: 16 mmol/L — AB (ref 22–32)
CO2: 16 mmol/L — AB (ref 22–32)
CO2: 16 mmol/L — AB (ref 22–32)
CO2: 16 mmol/L — ABNORMAL LOW (ref 22–32)
CO2: 14 mmol/L — ABNORMAL LOW (ref 22–32)
CREATININE: 2.36 mg/dL — AB (ref 0.61–1.24)
CREATININE: 2.47 mg/dL — AB (ref 0.61–1.24)
Calcium: 7.6 mg/dL — ABNORMAL LOW (ref 8.9–10.3)
Calcium: 7.6 mg/dL — ABNORMAL LOW (ref 8.9–10.3)
Calcium: 7.3 mg/dL — ABNORMAL LOW (ref 8.9–10.3)
Chloride: 107 mmol/L (ref 98–111)
Chloride: 109 mmol/L (ref 98–111)
Chloride: 108 mmol/L (ref 98–111)
Chloride: 109 mmol/L (ref 98–111)
Chloride: 107 mmol/L (ref 98–111)
Creatinine, Ser: 2.41 mg/dL — ABNORMAL HIGH (ref 0.61–1.24)
Creatinine, Ser: 2.4 mg/dL — ABNORMAL HIGH (ref 0.61–1.24)
Creatinine, Ser: 2.49 mg/dL — ABNORMAL HIGH (ref 0.61–1.24)
GFR calc Af Amer: 30 mL/min — ABNORMAL LOW (ref >60)
GFR calc Af Amer: 29 mL/min — ABNORMAL LOW (ref >60)
GFR calc Af Amer: 28 mL/min — ABNORMAL LOW (ref >60)
GFR calc non Af Amer: 26 mL/min — ABNORMAL LOW (ref >60)
GFR calc non Af Amer: 25 mL/min — ABNORMAL LOW (ref >60)
GFR calc non Af Amer: 24 mL/min — ABNORMAL LOW (ref >60)
GFR, EST AFRICAN AMERICAN: 29 mL/min — AB (ref >60)
GFR, EST AFRICAN AMERICAN: 28 mL/min — AB (ref >60)
GFR, EST NON AFRICAN AMERICAN: 25 mL/min — AB (ref >60)
GFR, EST NON AFRICAN AMERICAN: 24 mL/min — AB (ref >60)
GLUCOSE: 140 mg/dL — AB (ref 70–99)
Glucose, Bld: 130 mg/dL — ABNORMAL HIGH (ref 70–99)
Glucose, Bld: 117 mg/dL — ABNORMAL HIGH (ref 70–99)
Glucose, Bld: 102 mg/dL — ABNORMAL HIGH (ref 70–99)
Glucose, Bld: 89 mg/dL (ref 70–99)
Potassium: 4.4 mmol/L (ref 3.5–5.1)
Potassium: 4.7 mmol/L (ref 3.5–5.1)
Potassium: 4.6 mmol/L (ref 3.5–5.1)
Potassium: 4.3 mmol/L (ref 3.5–5.1)
Potassium: 4.5 mmol/L (ref 3.5–5.1)
Sodium: 136 mmol/L (ref 135–145)
Sodium: 137 mmol/L (ref 135–145)
Sodium: 138 mmol/L (ref 135–145)
Sodium: 139 mmol/L (ref 135–145)
Sodium: 136 mmol/L (ref 135–145)

## 2018-01-25 LAB — GLUCOSE, CAPILLARY
GLUCOSE-CAPILLARY: 120 mg/dL — AB (ref 70–99)
GLUCOSE-CAPILLARY: 135 mg/dL — AB (ref 70–99)
GLUCOSE-CAPILLARY: 142 mg/dL — AB (ref 70–99)
GLUCOSE-CAPILLARY: 157 mg/dL — AB (ref 70–99)
GLUCOSE-CAPILLARY: 166 mg/dL — AB (ref 70–99)
GLUCOSE-CAPILLARY: 172 mg/dL — AB (ref 70–99)
Glucose-Capillary: 206 mg/dL — ABNORMAL HIGH (ref 70–99)
Glucose-Capillary: 131 mg/dL — ABNORMAL HIGH (ref 70–99)
Glucose-Capillary: 127 mg/dL — ABNORMAL HIGH (ref 70–99)
Glucose-Capillary: 115 mg/dL — ABNORMAL HIGH (ref 70–99)
Glucose-Capillary: 98 mg/dL (ref 70–99)
Glucose-Capillary: 81 mg/dL (ref 70–99)

## 2018-01-25 LAB — POCT I-STAT, CHEM 8
BUN: 39 mg/dL — ABNORMAL HIGH (ref 8–23)
BUN: 40 mg/dL — ABNORMAL HIGH (ref 8–23)
BUN: 43 mg/dL — AB (ref 8–23)
BUN: 39 mg/dL — ABNORMAL HIGH (ref 8–23)
CALCIUM ION: 1.04 mmol/L — AB (ref 1.15–1.40)
CHLORIDE: 103 mmol/L (ref 98–111)
CHLORIDE: 104 mmol/L (ref 98–111)
Calcium, Ion: 1.05 mmol/L — ABNORMAL LOW (ref 1.15–1.40)
Calcium, Ion: 1.03 mmol/L — ABNORMAL LOW (ref 1.15–1.40)
Calcium, Ion: 1.04 mmol/L — ABNORMAL LOW (ref 1.15–1.40)
Chloride: 105 mmol/L (ref 98–111)
Chloride: 106 mmol/L (ref 98–111)
Creatinine, Ser: 2.3 mg/dL — ABNORMAL HIGH (ref 0.61–1.24)
Creatinine, Ser: 2.2 mg/dL — ABNORMAL HIGH (ref 0.61–1.24)
Creatinine, Ser: 2.3 mg/dL — ABNORMAL HIGH (ref 0.61–1.24)
Creatinine, Ser: 2.3 mg/dL — ABNORMAL HIGH (ref 0.61–1.24)
GLUCOSE: 154 mg/dL — AB (ref 70–99)
Glucose, Bld: 234 mg/dL — ABNORMAL HIGH (ref 70–99)
Glucose, Bld: 169 mg/dL — ABNORMAL HIGH (ref 70–99)
Glucose, Bld: 182 mg/dL — ABNORMAL HIGH (ref 70–99)
HCT: 34 % — ABNORMAL LOW (ref 39.0–52.0)
HCT: 33 % — ABNORMAL LOW (ref 39.0–52.0)
HCT: 35 % — ABNORMAL LOW (ref 39.0–52.0)
HEMATOCRIT: 35 % — AB (ref 39.0–52.0)
HEMOGLOBIN: 11.2 g/dL — AB (ref 13.0–17.0)
HEMOGLOBIN: 11.9 g/dL — AB (ref 13.0–17.0)
HEMOGLOBIN: 11.9 g/dL — AB (ref 13.0–17.0)
Hemoglobin: 11.6 g/dL — ABNORMAL LOW (ref 13.0–17.0)
Potassium: 3.2 mmol/L — ABNORMAL LOW (ref 3.5–5.1)
Potassium: 3.7 mmol/L (ref 3.5–5.1)
Potassium: 3.8 mmol/L (ref 3.5–5.1)
Potassium: 3.3 mmol/L — ABNORMAL LOW (ref 3.5–5.1)
SODIUM: 139 mmol/L (ref 135–145)
SODIUM: 139 mmol/L (ref 135–145)
SODIUM: 139 mmol/L (ref 135–145)
Sodium: 139 mmol/L (ref 135–145)
TCO2: 20 mmol/L — AB (ref 22–32)
TCO2: 20 mmol/L — ABNORMAL LOW (ref 22–32)
TCO2: 21 mmol/L — AB (ref 22–32)
TCO2: 21 mmol/L — ABNORMAL LOW (ref 22–32)

## 2018-01-25 LAB — POCT I-STAT 3, ART BLOOD GAS (G3+)
ACID-BASE DEFICIT: 5 mmol/L — AB (ref 0.0–2.0)
Bicarbonate: 18.2 mmol/L — ABNORMAL LOW (ref 20.0–28.0)
O2 SAT: 100 %
TCO2: 19 mmol/L — ABNORMAL LOW (ref 22–32)
pCO2 arterial: 24.3 mmHg — ABNORMAL LOW (ref 32.0–48.0)
pH, Arterial: 7.466 — ABNORMAL HIGH (ref 7.350–7.450)
pO2, Arterial: 228 mmHg — ABNORMAL HIGH (ref 83.0–108.0)

## 2018-01-25 LAB — CBC
HEMATOCRIT: 34.5 % — AB (ref 39.0–52.0)
HEMOGLOBIN: 11.1 g/dL — AB (ref 13.0–17.0)
MCH: 25.9 pg — AB (ref 26.0–34.0)
MCHC: 32.2 g/dL (ref 30.0–36.0)
MCV: 80.6 fL (ref 78.0–100.0)
Platelets: 144 10*3/uL — ABNORMAL LOW (ref 150–400)
RBC: 4.28 MIL/uL (ref 4.22–5.81)
RDW: 16.2 % — ABNORMAL HIGH (ref 11.5–15.5)
WBC: 3 10*3/uL — ABNORMAL LOW (ref 4.0–10.5)

## 2018-01-25 LAB — TROPONIN I
Troponin I: 1.9 ng/mL (ref <0.03)
Troponin I: 1.92 ng/mL (ref <0.03)
Troponin I: 1.29 ng/mL (ref <0.03)

## 2018-01-25 LAB — ECHOCARDIOGRAM COMPLETE
Height: 69 in
WEIGHTICAEL: 2959.1 [oz_av]

## 2018-01-25 LAB — MRSA PCR SCREENING: MRSA BY PCR: NEGATIVE

## 2018-01-25 LAB — TSH: TSH: 6.384 u[IU]/mL — ABNORMAL HIGH (ref 0.350–4.500)

## 2018-01-25 LAB — MAGNESIUM: Magnesium: 2 mg/dL (ref 1.7–2.4)

## 2018-01-25 LAB — PHOSPHORUS: Phosphorus: 2.4 mg/dL — ABNORMAL LOW (ref 2.5–4.6)

## 2018-01-25 MED ORDER — INFLUENZA VAC SPLIT HIGH-DOSE 0.5 ML IM SUSY
0.5000 mL | PREFILLED_SYRINGE | INTRAMUSCULAR | Status: AC
  Administered 2018-01-26: 0.5 mL via INTRAMUSCULAR
  Filled 2018-01-25: qty 0.5

## 2018-01-25 MED ORDER — POTASSIUM CHLORIDE 10 MEQ/50ML IV SOLN
10.0000 meq | INTRAVENOUS | Status: AC
  Administered 2018-01-25 (×2): 10 meq via INTRAVENOUS
  Filled 2018-01-25 (×2): qty 50

## 2018-01-25 MED ORDER — SODIUM CHLORIDE 0.9 % IV BOLUS
750.0000 mL | Freq: Once | INTRAVENOUS | Status: AC
  Administered 2018-01-25: 750 mL via INTRAVENOUS

## 2018-01-25 MED ORDER — NOREPINEPHRINE 16 MG/250ML-% IV SOLN
0.0000 ug/min | INTRAVENOUS | Status: DC
  Administered 2018-01-25: 5 ug/min via INTRAVENOUS
  Administered 2018-01-26: 18 ug/min via INTRAVENOUS
  Administered 2018-01-26: 15 ug/min via INTRAVENOUS
  Administered 2018-01-26: 12 ug/min via INTRAVENOUS
  Administered 2018-01-29: 4.053 ug/min via INTRAVENOUS
  Filled 2018-01-25 (×4): qty 250

## 2018-01-25 MED ORDER — INSULIN DETEMIR 100 UNIT/ML ~~LOC~~ SOLN
5.0000 [IU] | Freq: Two times a day (BID) | SUBCUTANEOUS | Status: DC
  Administered 2018-01-25 (×2): 5 [IU] via SUBCUTANEOUS
  Filled 2018-01-25 (×3): qty 0.05

## 2018-01-25 MED ORDER — DEXTROSE 50 % IV SOLN
INTRAVENOUS | Status: AC
  Administered 2018-01-25: 25 mL
  Filled 2018-01-25: qty 50

## 2018-01-25 MED ORDER — SODIUM CHLORIDE 0.9% FLUSH: 10.0000 mL | INTRAVENOUS | Status: DC | PRN

## 2018-01-25 MED ORDER — CHLORHEXIDINE GLUCONATE CLOTH 2 % EX PADS
6.0000 | MEDICATED_PAD | Freq: Every day | CUTANEOUS | Status: DC
  Administered 2018-01-25 – 2018-01-31 (×8): 6 via TOPICAL

## 2018-01-25 MED ORDER — POTASSIUM CHLORIDE 10 MEQ/50ML IV SOLN
10.0000 meq | INTRAVENOUS | Status: DC
  Administered 2018-01-25: 10 meq via INTRAVENOUS
  Filled 2018-01-25: qty 50

## 2018-01-25 MED ORDER — PNEUMOCOCCAL VAC POLYVALENT 25 MCG/0.5ML IJ INJ: 0.5000 mL | INJECTION | INTRAMUSCULAR | Status: DC | PRN

## 2018-01-25 MED ORDER — SODIUM CHLORIDE 0.9% FLUSH
10.0000 mL | Freq: Two times a day (BID) | INTRAVENOUS | Status: DC
  Administered 2018-01-25 (×2): 10 mL

## 2018-01-25 MED ORDER — INSULIN ASPART 100 UNIT/ML ~~LOC~~ SOLN
2.0000 [IU] | SUBCUTANEOUS | Status: DC
  Administered 2018-01-27: 4 [IU] via SUBCUTANEOUS
  Administered 2018-01-28: 2 [IU] via SUBCUTANEOUS
  Administered 2018-01-28: 4 [IU] via SUBCUTANEOUS
  Administered 2018-01-28: 2 [IU] via SUBCUTANEOUS

## 2018-01-25 NOTE — Progress Notes (Signed)
NAME:  Andrew Lewis, MRN:  [id number], DOB:  11-Jun-1945, LOS: 1 ADMISSION DATE:  02/02/2018, CONSULTATION DATE:  02/08/2018 REFERRING MD:  A. Zenia Resides (ED), CHIEF COMPLAINT:  S/P V-fib arrest   Brief History   Andrew Lewis is a 72 y.o. B M brought to [facility] ED following a V-fib arrest. Per the patient's wife, the were en route back to their home in [place], [place] from [place] and stopped at [employer] in [place]. After eating and getting back into their car, the patient suddenly became unresponsive without a prior complaint of chest pain, SOB or noticeable lateralizing signs or symptoms. EMS was called, arrived within 15 min and found the patient apneic and pulseless in V-fib arrest. He received shock x 3, epi x 2 and arrived on Epi drip; Amiodarone 300mg  was also given prior to arrival. He reportedly had intermittent pulses during the 30 min transport to [facility] with approximate down time to initial ROSC of 7 min. ECG post arrival without evidence of STEMI and TnI was 0.03.  The wife states that the patient has a hx HTN, DM and an arrhythmia in the past, for which he was on anticoagulants. He does see a cardiologist in [place]. He also has a hx OSA and uses CPAP Significant Hospital Events   Patient undergoing TTM protocol at [facility]. Started last night. Left IJ catheter inserted for levophend to maintain MAP > 57mmHg.   Consults: date of consult/date signed off & final recs:  Cardiology 9/15: will consider cardiac cath when and if meaningful recovery is achieved. Procedures (surgical and bedside):  Intubation: 9/15 CVC: 9/15  Significant Diagnostic Tests:  CT Head: 9/15  Micro Data: none  Antimicrobials:   none  Subjective:  Patient is sedated, paralyzed Objective   Blood pressure 110/71, pulse 63, temperature (Abnormal) 91 F (32.8 C), temperature source Core, resp. rate 16, height 5\' 9"  (1.753 m), weight 83.9 kg, SpO2 100 %. CVP:  [6 mmHg-13 mmHg] 8 mmHg  Vent Mode: PRVC FiO2 (%):  [40 %-100  %] 40 % Set Rate:  [16 bmp] 16 bmp Vt Set:  [570 mL] 570 mL PEEP:  [5 cmH20] 5 cmH20 Plateau Pressure:  [21 cmH20-25 cmH20] 25 cmH20   Intake/Output Summary (Last 24 hours) at 01/25/2018 1200 Last data filed at 01/25/2018 1100 Gross per 24 hour  Intake 3029.91 ml  Output 455 ml  Net 2574.91 ml   Filed Weights   01/12/2018 1455 01/31/2018 1631 01/25/18 0300  Weight: 90.7 kg 91.8 kg 83.9 kg    Examination: General: Sedated, paralyzed HENT: Pupils symmetrical, non reactive to light. No icterus. ETT/OGT in place. Left IJ catheter in place. Lungs: bilateral breath sounds. Clear to auscultation. No wheezes, crackles or rhonchi. Cardiovascular: s1s2, regular rhythm. No murmur, gallop or rub. Abdomen: cooling blanket in place. No bowel sounds. Extremities: +2 pulses. No cyanosis or edema Neuro: sedated and paralyzed. GU: foley catheter in place.  Resolved Hospital Problem list    Assessment & Plan:  1) S/P V-fib arrest with down time <45 min. No evidence AMI. Continue TTM as per protocol. 2) Hx OSA. No acute intervention required 3) HTN. Not a current issue as patient currently on pressors 4) Type II DM. SSI. 5) R pleural effusion with cardiomegaly. Likely chronic OHD with ventricular dysfn. Echo results show a reduced EF of 20-25%, diffuse hypokinesis (patient with a history of non ischemic DCM. PA estimated pressure elevated at 54 mmHG. Maintain euvolemia. 6) Probable CKD with increased creat. Follow urine output,  trend BMP and avoid nephrotoxic agents.  Disposition / Summary of Today's Plan 01/25/18   See above.    Diet: npo Pain/Anxiety/Delirium protocol (if indicated): N/A VAP protocol (if indicated) N/A DVT prophylaxis: sub q heparin GI prophylaxis: H2 blocker Hyperglycemia protocol: s/s insulin Mobility:bedbound Code Status: full code Family Communication: discussed care plan at the bedside with the wife.  Labs   CBC: Recent Labs  Lab 02/08/2018 1457  01/13/2018 2205  01/25/18 0013 01/25/18 0208 01/25/18 0310 01/25/18 0503  WBC 7.0  --   --   --   --   --  3.0*  NEUTROABS 1.1*  --   --   --   --   --   --   HGB 11.1*   < > 11.6* 11.2* 11.9* 11.9* 11.1*  HCT 36.6*   < > 34.0* 33.0* 35.0* 35.0* 34.5*  MCV 84.7  --   --   --   --   --  80.6  PLT 131*  --   --   --   --   --  144*   < > = values in this interval not displayed.   Basic Metabolic Panel: Recent Labs  Lab 01/14/2018 1457 02/02/2018 1705  01/25/18 0208 01/25/18 0310 01/25/18 0503 01/25/18 0748 01/25/18 1053  NA 140  --    < > 139 139 136 137 138  K 3.8  --    < > 3.7 3.3* 4.4 4.7 4.6  CL 111  --    < > 105 106 107 109 108  CO2 15*  --   --   --   --  16* 16* 16*  GLUCOSE 110*  --    < > 169* 154* 140* 130* 117*  BUN 35*  --    < > 43* 39* 44* 44* 45*  CREATININE 1.69*  --    < > 2.30* 2.30* 2.36* 2.41* 2.47*  CALCIUM 6.7*  --   --   --   --  7.6* 7.8* 7.7*  MG  --  1.7  --   --   --  2.0  --   --   PHOS  --   --   --   --   --  2.4*  --   --    < > = values in this interval not displayed.   GFR: Estimated Creatinine Clearance: 27 mL/min (A) (by C-G formula based on SCr of 2.47 mg/dL (H)). Recent Labs  Lab 02/06/2018 1455 01/22/2018 1457 01/28/2018 1711 01/25/18 0503  WBC  --  7.0  --  3.0*  LATICACIDVEN 4.37*  --  3.99*  --    Liver Function Tests: Recent Labs  Lab 01/13/2018 1457  AST 26  ALT 17  ALKPHOS 135*  BILITOT 1.1  PROT 5.5*  ALBUMIN 2.0*   No results for input(s): LIPASE, AMYLASE in the last 168 hours. No results for input(s): AMMONIA in the last 168 hours. ABG    Component Value Date/Time   PHART 7.466 (H) 01/25/2018 0320   PCO2ART 24.3 (L) 01/25/2018 0320   PO2ART 228.0 (H) 01/25/2018 0320   HCO3 18.2 (L) 01/25/2018 0320   TCO2 19 (L) 01/25/2018 0320   ACIDBASEDEF 5.0 (H) 01/25/2018 0320   O2SAT 100.0 01/25/2018 0320    Coagulation Profile: Recent Labs  Lab 01/26/2018 1505 01/21/2018 2259  INR 1.45 1.54   Cardiac Enzymes: Recent Labs  Lab  01/23/2018 1457 01/22/2018 1705 01/28/2018 2256 01/25/18 0503  TROPONINI 0.03*  0.29* 1.90* 1.92*   HbA1C: No results found for: HGBA1C CBG: Recent Labs  Lab 01/25/18 0359 01/25/18 0502 01/25/18 0602 01/25/18 0750 01/25/18 1055  GLUCAP 131* 135* 127* 120* 115*

## 2018-01-25 NOTE — Progress Notes (Signed)
EKG CRITICAL VALUE     12 lead EKG performed.  Critical value noted.Trilby Drummer, RN notified.   Jenesys Casseus L, CCT 01/25/2018 7:18 AM

## 2018-01-25 NOTE — Progress Notes (Signed)
Initial Nutrition Assessment  DOCUMENTATION CODES:   Not applicable  INTERVENTION:    If TF started, rec initiating Vital AF 1.2 at goal rate of 50 ml/h (1200 ml per day)  Prostat liquid protein 30 ml TID  Provides 1740 kcals, 135 gm protein, 973 ml free water daily  NUTRITION DIAGNOSIS:   Inadequate oral intake related to inability to eat as evidenced by NPO status  GOAL:   Patient will meet greater than or equal to 90% of their needs  MONITOR:   Vent status, Labs, Skin, Weight trends, I & O's  REASON FOR ASSESSMENT:   Ventilator  ASSESSMENT:   72 y.o. Male brought to [facility] ED following a V-fib arrest. Per the patient's wife, the were en route back to their home in [place], [place] from [place] and stopped at [employer] in [place]. After eating and getting back into their car, the patient suddenly became unresponsive without a prior complaint of chest pain, SOB or noticeable lateralizing signs or symptoms  Patient is currently intubated on ventilator support MV: 9.1 L/min Temp (24hrs), Avg:92.8 F (33.8 C), Min:88.5 F (31.4 C), Max:97.2 F (36.2 C)  OGT in place  RD unable to obtain nutrition history from patient. He is currently on post cardiac arrest temp management protocol. Left IJ catheter inserted for Levophend to maintain MAP > 72mmHg.  Medications include Nimbex, Versed & Pepcid. Labs reviewed. BUN 45 (H). Cr 2.40 (H). CBG's [id number].  NUTRITION - FOCUSED PHYSICAL EXAM:    Most Recent Value  Orbital Region  No depletion  Upper Arm Region  No depletion  Thoracic and Lumbar Region  Unable to assess  Buccal Region  No depletion  Temple Region  No depletion  Clavicle Bone Region  No depletion  Clavicle and Acromion Bone Region  No depletion  Scapular Bone Region  Unable to assess  Dorsal Hand  Unable to assess  Patellar Region  No depletion  Anterior Thigh Region  No depletion  Posterior Calf Region  No depletion     Diet Order:   Diet  Order    None     EDUCATION NEEDS:   Not appropriate for education at this time  Skin:  Skin Assessment: Reviewed RN Assessment  Last BM:  9/16    Intake/Output Summary (Last 24 hours) at 01/25/2018 1642 Last data filed at 01/25/2018 1600 Gross per 24 hour  Intake 4550.93 ml  Output 505 ml  Net 4045.93 ml   Height:   Ht Readings from Last 1 Encounters:  01/21/2018 5\' 9"  (1.753 m)   Weight:   Wt Readings from Last 1 Encounters:  01/25/18 83.9 kg   Ideal Body Weight:  72.7 kg  BMI:  Body mass index is 27.31 kg/m.  Estimated Nutritional Needs (using 37 degrees C):  Kcal:  1769  Protein:  125-140 gm  Fluid:  per MD  Arthur Holms, RD, LDN Pager #: [phone] After-Hours Pager #: [phone]

## 2018-01-25 NOTE — Progress Notes (Signed)
[facility] Progress Note Patient Name: Andrew Lewis DOB: May 19, 1945 MRN: [id number]   Date of Service  01/25/2018  HPI/Events of Note  K+ = 3.3 and Creatinine = 2.3.  eICU Interventions  Will cautiously replace K+.     Intervention Category Major Interventions: Electrolyte abnormality - evaluation and management  Jeanette Moffatt Eugene 01/25/2018, 3:34 AM

## 2018-01-25 NOTE — Progress Notes (Addendum)
Progress Note  Patient Name: Andrew Lewis Date of Encounter: 01/25/2018  Primary Cardiologist: No primary care provider on file. Dr. Ashby Dawes [facility] in [place], [place])  Subjective   Pt sedated and intubated.   Inpatient Medications    Scheduled Meds: . artificial tears  1 application Both Eyes C9O  . aspirin  300 mg Rectal NOW  . chlorhexidine gluconate (MEDLINE KIT)  15 mL Mouth Rinse BID  . Chlorhexidine Gluconate Cloth  6 each Topical Daily  . famotidine  20 mg Per Tube BID  . fentaNYL (SUBLIMAZE) injection  50 mcg Intravenous Once  . heparin  5,000 Units Subcutaneous Q8H  . insulin aspart  2-6 Units Subcutaneous Q4H  . insulin detemir  5 Units Subcutaneous Q12H  . mouth rinse  15 mL Mouth Rinse 10 times per day  . midazolam  1 mg Intravenous Once  . sodium chloride flush  10-40 mL Intracatheter Q12H   Continuous Infusions: . sodium chloride 10 mL/hr at 01/20/2018 2038  . sodium chloride    . sodium chloride 100 mL/hr at 01/23/2018 2359  . amiodarone 30 mg/hr (01/25/18 0603)  . cisatracurium (NIMBEX) infusion 1 mcg/kg/min (01/26/2018 1825)  . epinephrine 0.5 mcg/min (01/11/2018 1503)  . fentaNYL infusion INTRAVENOUS 250 mcg/hr (01/25/18 0416)  . midazolam (VERSED) infusion 3 mg/hr (01/25/18 0126)  . norepinephrine (LEVOPHED) Adult infusion 5 mcg/min (01/25/18 0126)   PRN Meds: Place/Maintain arterial line **AND** sodium chloride, [COMPLETED] cisatracurium **AND** cisatracurium (NIMBEX) infusion **AND** cisatracurium, fentaNYL, midazolam, sodium chloride flush   Vital Signs    Vitals:   01/25/18 0730 01/25/18 0743 01/25/18 0745 01/25/18 0800  BP:    137/83  Pulse: 60  61 61  Resp: _0 Temp:  (!) 91.8 F (33.2 C)  (!) 91.4 F (33 C)  TempSrc:  Core  Core  SpO2: 100%  100% 100%  Weight:      Height:        Intake/Output Summary (Last 24 hours) at 01/25/2018 0807 Last data filed at 01/25/2018 0600 Gross per 24 hour  Intake 1983.71 ml  Output  415 ml  Net 1568.71 ml   Filed Weights   01/31/2018 1455 01/17/2018 1631 01/25/18 0300  Weight: 90.7 kg 91.8 kg 83.9 kg    Telemetry    Sinus, NSVT - Personally Reviewed  ECG    Sinus, 1st degree AV block. Nonspecific ST and T wave abn, ? Inferior Q waves - Personally Reviewed  Physical Exam   GEN: Sedated and intubated.  Neck: No JVD Cardiac: RRR, no murmurs, rubs, or gallops.  Respiratory: Clear to auscultation bilaterally. GI: Soft, nontender, non-distended  MS: No edema; No deformity. Neuro:  Nonfocal  Psych: Normal affect   Labs    Chemistry Recent Labs  Lab 01/27/2018 1457  01/25/18 0208 01/25/18 0310 01/25/18 0503  NA 140   < > 139 139 136  K 3.8   < > 3.7 3.3* 4.4  CL 111   < > 105 106 107  CO2 15*  --   --   --  16*  GLUCOSE 110*   < > 169* 154* 140*  BUN 35*   < > 43* 39* 44*  CREATININE 1.69*   < > 2.30* 2.30* 2.36*  CALCIUM 6.7*  --   --   --  7.6*  PROT 5.5*  --   --   --   --   ALBUMIN 2.0*  --   --   --   --  AST 26  --   --   --   --   ALT 17  --   --   --   --   ALKPHOS 135*  --   --   --   --   BILITOT 1.1  --   --   --   --   GFRNONAA 39*  --   --   --  26*  GFRAA 45*  --   --   --  30*  ANIONGAP 14  --   --   --  13   < > = values in this interval not displayed.     Hematology Recent Labs  Lab 01/27/2018 1457  01/25/18 0208 01/25/18 0310 01/25/18 0503  WBC 7.0  --   --   --  3.0*  RBC 4.32  --   --   --  4.28  HGB 11.1*   < > 11.9* 11.9* 11.1*  HCT 36.6*   < > 35.0* 35.0* 34.5*  MCV 84.7  --   --   --  80.6  MCH 25.7*  --   --   --  25.9*  MCHC 30.3  --   --   --  32.2  RDW 16.6*  --   --   --  16.2*  PLT 131*  --   --   --  144*   < > = values in this interval not displayed.    Cardiac Enzymes Recent Labs  Lab 01/11/2018 1457 01/17/2018 1705 01/20/2018 2256 01/25/18 0503  TROPONINI 0.03* 0.29* 1.90* 1.92*    Recent Labs  Lab 01/15/2018 1446  TROPIPOC 0.01     BNPNo results for input(s): BNP, PROBNP in the last 168 hours.     DDimer No results for input(s): DDIMER in the last 168 hours.   Radiology    Ct Head Wo Contrast  Result Date: 01/31/2018 CLINICAL DATA:  Post cardiac arrest today. EXAM: CT HEAD WITHOUT CONTRAST TECHNIQUE: Contiguous axial images were obtained from the base of the skull through the vertex without intravenous contrast. COMPARISON:  None. FINDINGS: Brain: Ventricles, cisterns and other CSF spaces are within normal. There is chronic ischemic microvascular disease. There is no mass, mass effect, shift of midline structures or acute hemorrhage. Subtle low-attenuation over the left cerebellar hemisphere not seen on the coronal or sagittal images. This may represent averaging through prominent CSF space versus acute to subacute ischemic change. Vascular: No hyperdense vessel or unexpected calcification. Skull: Normal. Negative for fracture or focal lesion. Sinuses/Orbits: No acute finding. Other: None. IMPRESSION: No acute brain injury. Subtle low-attenuation over the left cerebellar hemisphere which may be due prominent CSF space versus acute subacute ischemic change. Chronic ischemic microvascular disease. Electronically Signed   By: Marin Olp M.D.   On: 01/15/2018 17:58   Dg Chest Port 1 View  Result Date: 01/31/2018 CLINICAL DATA:  Central line placement EXAM: PORTABLE CHEST 1 VIEW COMPARISON:  02/02/2018 FINDINGS: Endotracheal tube tip is about 4.6 cm superior to the carina. Esophageal tube tip is below the diaphragm but non included. Left IJ central venous catheter tip projects over the venous confluence. No left pneumothorax. Cardiomegaly with small pleural effusion, right greater than left and hazy atelectasis or edema at the right base. Vascular congestion. IMPRESSION: 1. Left-sided central venous catheter tip overlies the venous confluence. No pneumothorax 2. Cardiomegaly with vascular congestion and continued right pleural effusion and hazy atelectasis or edema at the right base.  Electronically Signed  By: Donavan Foil M.D.   On: 01/13/2018 23:44   Dg Chest Portable 1 View  Result Date: 02/08/2018 CLINICAL DATA:  Status post endotracheal tube placement and CPR EXAM: PORTABLE CHEST 1 VIEW COMPARISON:  None. FINDINGS: Cardiac shadow is prominent. Endotracheal tube and nasogastric catheter are noted in satisfactory position. Right-sided pleural effusion is noted with right basilar atelectasis. No acute bony abnormality is noted. IMPRESSION: Tubes and lines in satisfactory position. Right pleural effusion with basilar atelectasis. Electronically Signed   By: Inez Catalina M.D.   On: 01/26/2018 15:14    Cardiac Studies     Patient Profile     72 y.o. male with known NICM with LVEF=10% by echo 01/21/18, CKD, prior PE on  Coumadin, HTN, HLD admitted following out of hospital VF arrest. Pt is followed by cardiology in [place], [place] and has refused ICD in the past.   Assessment & Plan    1. NICM/Ventricular fibrillation arrest: he is followed for a dilated NICM in [place]. He has refused ICD in the past. Now admitted with a VF arrest. He is on the hypothermia protocol. Echo is pending for today. Would continue IV amiodarone. If he has neurological recovery, could consider cardiac cath and have another discussion regarding ICD.   2. NSVT: He is still having short runs of NSVT. Continue IV amiodarone.   For questions or updates, please contact [facility] Please consult [email or web address] for contact info under        Signed, Lauree Chandler, MD  01/25/2018, 8:07 AM

## 2018-01-25 NOTE — Plan of Care (Signed)
  Problem: Cardiac: Goal: Ability to achieve and maintain adequate cardiopulmonary perfusion will improve Outcome: Progressing   

## 2018-01-25 NOTE — Progress Notes (Signed)
  Echocardiogram 2D Echocardiogram has been performed.  [place] Delong L Androw 01/25/2018, 8:38 AM

## 2018-01-25 NOTE — Progress Notes (Signed)
EEG complete - results pending 

## 2018-01-25 NOTE — Progress Notes (Signed)
Verbally addressed low UOP with physician, Loleta Chance, MD. No new orders given. Will continue to monitor.

## 2018-01-25 NOTE — Procedures (Signed)
ELECTROENCEPHALOGRAM REPORT   Patient: Andrew Lewis       Room #: [facility] EEG No. ID: [id number] Age: 72 y.o.        Sex: male Referring Physician: Crim Report Date:  01/25/2018        Interpreting Physician: Alexis Goodell  History: Andrew Lewis is an 72 y.o. male s/p arrest  Medications:  ASA, Pepcid, Insulin, Amiodarone, Nimbex, Epinephrine, Versed, Fentanyl, Levophed  Conditions of Recording:  This is a 21 channel routine scalp EEG performed with bipolar and monopolar montages arranged in accordance to the international 10/20 system of electrode placement. One channel was dedicated to EKG recording.  The patient is in the intubated and sedated state.  Description:  The background activity is discontinuous. It consists of bursts of polyspike, spike and slow wave activity alternating with periods of attenuation. The burst activity lasts up to 15 seconds. The periods of attenuation last up to 20 seconds.  This discontinuous activity is maintained throughout the tracing.   Hyperventilation and intermittent photic stimulation were not performed.   IMPRESSION: This is an abnormal electroencephalogram secondary to burst suppression activity which can be seen in cases of significant diffuse brain injury, is also consistent with the patient's current medical regimen.  Clinical correlation recommended.     Alexis Goodell, MD Neurology [phone] 01/25/2018, 2:18 PM

## 2018-01-25 NOTE — Progress Notes (Signed)
[place] Progress Note Patient Name: Khai Arrona DOB: [birth year] MRN: [id number]   Date of Service  01/25/2018  HPI/Events of Note  K+ = 3.0 and Creatinine = 2.3.  eICU Interventions  Will cautiously replace K+.     Intervention Category Major Interventions: Electrolyte abnormality - evaluation and management  Sommer,Steven Eugene 01/25/2018, 6:14 AM

## 2018-01-26 ENCOUNTER — Inpatient Hospital Stay (HOSPITAL_COMMUNITY): Payer: Medicare HMO

## 2018-01-26 DIAGNOSIS — J9601 Acute respiratory failure with hypoxia: Secondary | ICD-10-CM

## 2018-01-26 DIAGNOSIS — J9600 Acute respiratory failure, unspecified whether with hypoxia or hypercapnia: Secondary | ICD-10-CM

## 2018-01-26 DIAGNOSIS — R570 Cardiogenic shock: Secondary | ICD-10-CM

## 2018-01-26 DIAGNOSIS — R569 Unspecified convulsions: Secondary | ICD-10-CM

## 2018-01-26 LAB — BASIC METABOLIC PANEL
Anion gap: 11 (ref 5–15)
Anion gap: 14 (ref 5–15)
Anion gap: 15 (ref 5–15)
Anion gap: 16 — ABNORMAL HIGH (ref 5–15)
BUN: 45 mg/dL — ABNORMAL HIGH (ref 8–23)
BUN: 47 mg/dL — ABNORMAL HIGH (ref 8–23)
BUN: 48 mg/dL — ABNORMAL HIGH (ref 8–23)
BUN: 49 mg/dL — ABNORMAL HIGH (ref 8–23)
CO2: 14 mmol/L — ABNORMAL LOW (ref 22–32)
CO2: 15 mmol/L — ABNORMAL LOW (ref 22–32)
CO2: 15 mmol/L — ABNORMAL LOW (ref 22–32)
CO2: 15 mmol/L — ABNORMAL LOW (ref 22–32)
Calcium: 7.2 mg/dL — ABNORMAL LOW (ref 8.9–10.3)
Calcium: 7.2 mg/dL — ABNORMAL LOW (ref 8.9–10.3)
Calcium: 7.4 mg/dL — ABNORMAL LOW (ref 8.9–10.3)
Calcium: 7.5 mg/dL — ABNORMAL LOW (ref 8.9–10.3)
Chloride: 106 mmol/L (ref 98–111)
Chloride: 108 mmol/L (ref 98–111)
Chloride: 108 mmol/L (ref 98–111)
Chloride: 112 mmol/L — ABNORMAL HIGH (ref 98–111)
Creatinine, Ser: 2.57 mg/dL — ABNORMAL HIGH (ref 0.61–1.24)
Creatinine, Ser: 2.63 mg/dL — ABNORMAL HIGH (ref 0.61–1.24)
Creatinine, Ser: 2.77 mg/dL — ABNORMAL HIGH (ref 0.61–1.24)
Creatinine, Ser: 2.93 mg/dL — ABNORMAL HIGH (ref 0.61–1.24)
GFR calc Af Amer: 23 mL/min — ABNORMAL LOW (ref >60)
GFR calc Af Amer: 25 mL/min — ABNORMAL LOW (ref >60)
GFR calc Af Amer: 26 mL/min — ABNORMAL LOW (ref >60)
GFR calc Af Amer: 27 mL/min — ABNORMAL LOW (ref >60)
GFR calc non Af Amer: 20 mL/min — ABNORMAL LOW (ref >60)
GFR calc non Af Amer: 21 mL/min — ABNORMAL LOW (ref >60)
GFR calc non Af Amer: 23 mL/min — ABNORMAL LOW (ref >60)
GFR calc non Af Amer: 23 mL/min — ABNORMAL LOW (ref >60)
Glucose, Bld: 76 mg/dL (ref 70–99)
Glucose, Bld: 86 mg/dL (ref 70–99)
Glucose, Bld: 88 mg/dL (ref 70–99)
Glucose, Bld: 94 mg/dL (ref 70–99)
Potassium: 4.7 mmol/L (ref 3.5–5.1)
Potassium: 4.9 mmol/L (ref 3.5–5.1)
Potassium: 5.2 mmol/L — ABNORMAL HIGH (ref 3.5–5.1)
Potassium: 5.4 mmol/L — ABNORMAL HIGH (ref 3.5–5.1)
Sodium: 136 mmol/L (ref 135–145)
Sodium: 137 mmol/L (ref 135–145)
Sodium: 138 mmol/L (ref 135–145)
Sodium: 138 mmol/L (ref 135–145)

## 2018-01-26 LAB — POCT I-STAT 3, ART BLOOD GAS (G3+)
ACID-BASE DEFICIT: 11 mmol/L — AB (ref 0.0–2.0)
ACID-BASE DEFICIT: 7 mmol/L — AB (ref 0.0–2.0)
Acid-base deficit: 11 mmol/L — ABNORMAL HIGH (ref 0.0–2.0)
BICARBONATE: 14.2 mmol/L — AB (ref 20.0–28.0)
Bicarbonate: 13.9 mmol/L — ABNORMAL LOW (ref 20.0–28.0)
Bicarbonate: 18 mmol/L — ABNORMAL LOW (ref 20.0–28.0)
O2 Saturation: 100 %
O2 Saturation: 100 %
O2 Saturation: 99 %
PCO2 ART: 27.1 mmHg — AB (ref 32.0–48.0)
PCO2 ART: 32.9 mmHg (ref 32.0–48.0)
PO2 ART: 195 mmHg — AB (ref 83.0–108.0)
PO2 ART: 142 mmHg — AB (ref 83.0–108.0)
TCO2: 15 mmol/L — ABNORMAL LOW (ref 22–32)
TCO2: 15 mmol/L — ABNORMAL LOW (ref 22–32)
TCO2: 19 mmol/L — ABNORMAL LOW (ref 22–32)
pCO2 arterial: 28.5 mmHg — ABNORMAL LOW (ref 32.0–48.0)
pH, Arterial: 7.32 — ABNORMAL LOW (ref 7.350–7.450)
pH, Arterial: 7.304 — ABNORMAL LOW (ref 7.350–7.450)
pH, Arterial: 7.345 — ABNORMAL LOW (ref 7.350–7.450)
pO2, Arterial: 204 mmHg — ABNORMAL HIGH (ref 83.0–108.0)

## 2018-01-26 LAB — CORTISOL: Cortisol, Plasma: 18.7 ug/dL

## 2018-01-26 LAB — GLUCOSE, CAPILLARY
GLUCOSE-CAPILLARY: 92 mg/dL (ref 70–99)
Glucose-Capillary: 110 mg/dL — ABNORMAL HIGH (ref 70–99)
Glucose-Capillary: 126 mg/dL — ABNORMAL HIGH (ref 70–99)
Glucose-Capillary: 54 mg/dL — ABNORMAL LOW (ref 70–99)
Glucose-Capillary: 68 mg/dL — ABNORMAL LOW (ref 70–99)
Glucose-Capillary: 69 mg/dL — ABNORMAL LOW (ref 70–99)
Glucose-Capillary: 77 mg/dL (ref 70–99)
Glucose-Capillary: 83 mg/dL (ref 70–99)
Glucose-Capillary: 86 mg/dL (ref 70–99)

## 2018-01-26 LAB — LACTIC ACID, PLASMA: Lactic Acid, Venous: 3.4 mmol/L (ref 0.5–1.9)

## 2018-01-26 LAB — MAGNESIUM
Magnesium: 1.9 mg/dL (ref 1.7–2.4)
Magnesium: 1.8 mg/dL (ref 1.7–2.4)

## 2018-01-26 LAB — PHOSPHORUS
PHOSPHORUS: 4.3 mg/dL (ref 2.5–4.6)
Phosphorus: 4.3 mg/dL (ref 2.5–4.6)

## 2018-01-26 LAB — SODIUM, URINE, RANDOM: Sodium, Ur: 24 mmol/L

## 2018-01-26 LAB — CREATININE, URINE, RANDOM: Creatinine, Urine: 155.38 mg/dL

## 2018-01-26 MED ORDER — DEXTROSE 50 % IV SOLN
25.0000 mL | Freq: Once | INTRAVENOUS | Status: AC
  Administered 2018-01-26: 25 mL via INTRAVENOUS
  Filled 2018-01-26: qty 50

## 2018-01-26 MED ORDER — PRO-STAT SUGAR FREE PO LIQD
30.0000 mL | Freq: Two times a day (BID) | ORAL | Status: DC
  Administered 2018-01-26: 30 mL
  Filled 2018-01-26 (×2): qty 30

## 2018-01-26 MED ORDER — DEXTROSE 50 % IV SOLN
25.0000 mL | Freq: Once | INTRAVENOUS | Status: AC
  Administered 2018-01-26: 25 mL via INTRAVENOUS

## 2018-01-26 MED ORDER — VALPROATE SODIUM 500 MG/5ML IV SOLN
500.0000 mg | Freq: Two times a day (BID) | INTRAVENOUS | Status: DC
  Administered 2018-01-26 – 2018-01-31 (×10): 500 mg via INTRAVENOUS
  Filled 2018-01-26 (×10): qty 5

## 2018-01-26 MED ORDER — SODIUM BICARBONATE 8.4 % IV SOLN
INTRAVENOUS | Status: DC
  Administered 2018-01-26 (×3): via INTRAVENOUS
  Filled 2018-01-26 (×5): qty 150

## 2018-01-26 MED ORDER — PRO-STAT SUGAR FREE PO LIQD
30.0000 mL | Freq: Three times a day (TID) | ORAL | Status: DC
  Administered 2018-01-26 – 2018-01-31 (×16): 30 mL
  Filled 2018-01-26 (×16): qty 30

## 2018-01-26 MED ORDER — VITAL HIGH PROTEIN PO LIQD
1000.0000 mL | ORAL | Status: DC
  Administered 2018-01-26: 1000 mL

## 2018-01-26 MED ORDER — VITAL AF 1.2 CAL PO LIQD
1000.0000 mL | ORAL | Status: DC
  Administered 2018-01-26 – 2018-01-31 (×4): 1000 mL

## 2018-01-26 MED ORDER — SODIUM CHLORIDE 0.9 % IV SOLN
1.5000 g | Freq: Two times a day (BID) | INTRAVENOUS | Status: DC
  Administered 2018-01-26 – 2018-02-01 (×13): 1.5 g via INTRAVENOUS
  Filled 2018-01-26 (×13): qty 1.5

## 2018-01-26 MED ORDER — VALPROATE SODIUM 500 MG/5ML IV SOLN
1000.0000 mg | Freq: Once | INTRAVENOUS | Status: AC
  Administered 2018-01-26: 1000 mg via INTRAVENOUS
  Filled 2018-01-26: qty 10

## 2018-01-26 MED ORDER — FAMOTIDINE 40 MG/5ML PO SUSR
20.0000 mg | Freq: Every day | ORAL | Status: DC
  Administered 2018-01-27 – 2018-01-31 (×5): 20 mg
  Filled 2018-01-26 (×6): qty 2.5

## 2018-01-26 MED FILL — Medication: Qty: 1 | Status: AC

## 2018-01-26 NOTE — Progress Notes (Signed)
NAME:  Andrew Lewis, MRN:  [id number], DOB:  08/25/1945, LOS: 2 ADMISSION DATE:  02/06/2018, CONSULTATION DATE:  02/02/2018 REFERRING MD:  A. Zenia Resides (ED), CHIEF COMPLAINT:  S/P V-fib arrest   Brief History   Andrew Lewis is a 72 y.o. B M brought to [facility] ED following a V-fib arrest. Per the patient's wife, the were en route back to their home in [place], [place] from [place] and stopped at [employer] in [place]. After eating and getting back into their car, the patient suddenly became unresponsive without a prior complaint of chest pain, SOB or noticeable lateralizing signs or symptoms. EMS was called, arrived within 15 min and found the patient apneic and pulseless in V-fib arrest. He received shock x 3, epi x 2 and arrived on Epi drip; Amiodarone 300mg  was also given prior to arrival. He reportedly had intermittent pulses during the 30 min transport to [facility] with approximate down time to initial ROSC of 7 min. ECG post arrival without evidence of STEMI and TnI was 0.03.  The wife states that the patient has a hx HTN, DM and an arrhythmia in the past, for which he was on anticoagulants. He does see a cardiologist in [place]. He also has a hx OSA and uses CPAP Significant Hospital Events   9/16 patient undergoing TTM protocol at [facility]. Started last night. Left IJ catheter inserted for levophend to maintain MAP > 56mmHg.  9/17: Temperature management protocol discontinued at 7 AM with neuromuscular blockade stopped.  Lip twitching and eye movement worrisome for possible status.  Stat EEG and neurology consult requested.  Remains in shock with escalating pressor requirements, worsening acidosis, and worsening endorgan function.  Pancultured.  Empiric Unasyn started.  Bicarbonate infusion started for non-gap component of acidosis  Consults: date of consult/date signed off & final recs:  Cardiology 9/15: will consider cardiac cath when and if meaningful recovery is achieved. Neurology consulted on 9/17 for  possible seizure Procedures (surgical and bedside):  Intubation: 9/15 CVC: 9/15  Significant Diagnostic Tests:  CT Head: 9/15: neg EEG 9/16: burst suppression. No seizure  Echocardiogram 9/16:Ejection fraction 20 to 25% cavity mildly dilated systolic function severely reduced EEG 9/17>>> Renal ultrasound 9/17>>> Urine sodium 9/17>>> Urine creatinine 9/17>>>  Micro Data: Blood culture times two 9/17 Urine culture 9/17 Sputum culture 9/17   Antimicrobials:  Unasyn 9/17  Subjective:  Unresponsive Objective   Blood pressure 106/73, pulse 83, temperature (Abnormal) 97.2 F (36.2 C), temperature source Esophageal, resp. rate 16, height 5\' 9"  (1.753 m), weight 87.7 kg, SpO2 100 %. CVP:  [8 mmHg-16 mmHg] 14 mmHg  Vent Mode: PRVC FiO2 (%):  [40 %] 40 % Set Rate:  [16 bmp] 16 bmp Vt Set:  [570 mL] 570 mL PEEP:  [5 cmH20] 5 cmH20 Plateau Pressure:  [24 cmH20-26 cmH20] 24 cmH20   Intake/Output Summary (Last 24 hours) at 01/26/2018 0805 Last data filed at 01/26/2018 0600 Gross per 24 hour  Intake 4323.68 ml  Output 440 ml  Net 3883.68 ml   Filed Weights   01/15/2018 1631 01/25/18 0300 01/26/18 0400  Weight: 91.8 kg 83.9 kg 87.7 kg    Examination: General: This is a well-developed 72 year old African-American male.  He is status post completion of neuromuscular blockade at 7 AM.  He is currently unresponsive however does exhibit rhythmic lower lip movements as well as eye twitching. HEENT: Normocephalic atraumatic orally intubated no JVD pupils equal reactive to light Pulmonary: Clear to auscultation equal chest rise on mechanically assisted vent  breath Cardiac: Regular rate and rhythm sinus bradycardia no murmur rub or gallop is appreciated Abdomen: Soft, hypoactive bowel sounds.  Bilious gastric residual from OG tube Extremities: Cool, pulses are palpable.  No edema Neuro: Unresponsive.  Pupils reactive.  Twitching of lower lips as noted above.  Resolved/inactive Hospital  Problem list    Assessment & Plan:   S/P V-fib arrest in setting of known nonischemic cardiomyopathy.  He has an ejection fraction 10%  Remains in cardiogenic shock -Given worsening shock state also consider alternative complicating diagnoses such as sepsis -No evidence of acute MI -Increasing norepinephrine requirements overnight, acid-base may also be a contributing factor Plan Continue to titrate norepinephrine for mean arterial pressure greater than 80 CVP goal greater than 8  Checking arterial blood gas pH less than 7.2 we will add bicarbonate support Checking serum cortisol We will also check blood cultures, CBC, and sputum culture and chest x-ray Stopping amiodarone secondary to prolonged QTC Cardiology on standby, should he have neurological recovery would need ischemia evaluation as well as ICD prior to discharge  Acute hypoxic respiratory failure status post cardiac arrest, with right-sided pulmonary infiltrate and pleural effusion His chest x-ray personally reviewed on 9/15 demonstrated endotracheal tube to be in satisfactory position however he did have right-sided airspace disease with element of effusion.  I worry this could represent an element of aspiration given clinical scenario Plan Continuing full ventilator support Will need serial arterial blood gas checks Following up chest x-ray Sputum culture Empiric Unasyn VAP bundle  Acute metabolic encephalopathy, presumed hypoxic encephalopathy. Rule out status epilepticus -Had 7-minute time to ROSC but unknown downtime prior to that. -EEG initially demonstrating burst suppression consistent with severe neurological injury, CT head was normal -He is status post temperature management protocol, neuromuscular blockade was discontinued at 7 AM on 9/17 -Twitching of her right lower lip, rhythmic eye movement noted 9/17 Plan Stat EEG Neurology consulted Continuing supportive care Avoiding fever PAD protocol unless  benzodiazepine/anticonvulsant therapy required to suppress seizure, this is to be determined currently  Acute on chronic renal failure -Unknown baseline -Oliguric over the last 24 hours -Serum creatinine continues to rise Plan Continuing hemodynamic support Renal dose medications Strict intake output Leave Foley catheter in place Daily chemistries Check urine sodium and creatinine Check renal ultrasound  Mixed anion gap and non-anion gap metabolic acidosis.  I suspect this is a mix of both renal failure and organ hypoperfusion Plan Check lactic acid Start bicarbonate drip Serial arterial blood gases, as we will need to adjust his minute ventilation as we correct his acidosis  Obstructive sleep apnea Plan Would need evaluation for CPAP for tracheostomy pending outcome   Type II DM. Plan Sliding scale insulin  Right lower extremity externally rotated.  ? MS injury Plan Hold off on imaging currently given poor prognsosis  Mild anemia without evidence of bleeding Plan Trend CBC  Mild thrombocytopenia Plan Follow-up CBC  Disposition / Summary of Today's Plan 01/26/18   See above.    Diet: npo-->start tubefeeds 9/17 Pain/Anxiety/Delirium protocol (if indicated): To be determined pending neurology consultation 9/17 VAP protocol (if indicated): Ordered DVT prophylaxis: sub q heparin GI prophylaxis: H2 blocker Hyperglycemia protocol: s/s insulin Mobility:bedbound Code Status: full code Family Communication: discussed care plan at the bedside with the wife.  Labs   CBC: Recent Labs  Lab 01/22/2018 1457  01/15/2018 2205 01/25/18 0013 01/25/18 0208 01/25/18 0310 01/25/18 0503  WBC 7.0  --   --   --   --   --  3.0*  NEUTROABS 1.1*  --   --   --   --   --   --   HGB 11.1*   < > 11.6* 11.2* 11.9* 11.9* 11.1*  HCT 36.6*   < > 34.0* 33.0* 35.0* 35.0* 34.5*  MCV 84.7  --   --   --   --   --  80.6  PLT 131*  --   --   --   --   --  144*   < > = values in this  interval not displayed.   Basic Metabolic Panel: Recent Labs  Lab 01/27/2018 1705  01/25/18 0503  01/25/18 1053 01/25/18 1543 01/25/18 1943 01/25/18 2347 01/26/18 0353  NA  --    < > 136   < > 138 139 136 138 136  K  --    < > 4.4   < > 4.6 4.3 4.5 4.7 4.9  CL  --    < > 107   < > 108 109 107 112* 106  CO2  --   --  16*   < > 16* 16* 14* 15* 14*  GLUCOSE  --    < > 140*   < > 117* 102* 89 76 86  BUN  --    < > 44*   < > 45* 45* 47* 47* 45*  CREATININE  --    < > 2.36*   < > 2.47* 2.40* 2.49* 2.57* 2.63*  CALCIUM  --   --  7.6*   < > 7.7* 7.6* 7.3* 7.2* 7.4*  MG 1.7  --  2.0  --   --   --   --   --   --   PHOS  --   --  2.4*  --   --   --   --   --   --    < > = values in this interval not displayed.   GFR: Estimated Creatinine Clearance: 27.8 mL/min (A) (by C-G formula based on SCr of 2.63 mg/dL (H)). Recent Labs  Lab 01/31/2018 1455 01/19/2018 1457 01/15/2018 1711 01/25/18 0503  WBC  --  7.0  --  3.0*  LATICACIDVEN 4.37*  --  3.99*  --    Liver Function Tests: Recent Labs  Lab 01/21/2018 1457  AST 26  ALT 17  ALKPHOS 135*  BILITOT 1.1  PROT 5.5*  ALBUMIN 2.0*   No results for input(s): LIPASE, AMYLASE in the last 168 hours. No results for input(s): AMMONIA in the last 168 hours. ABG    Component Value Date/Time   PHART 7.466 (H) 01/25/2018 0320   PCO2ART 24.3 (L) 01/25/2018 0320   PO2ART 228.0 (H) 01/25/2018 0320   HCO3 18.2 (L) 01/25/2018 0320   TCO2 19 (L) 01/25/2018 0320   ACIDBASEDEF 5.0 (H) 01/25/2018 0320   O2SAT 100.0 01/25/2018 0320    Coagulation Profile: Recent Labs  Lab 02/03/2018 1505 02/02/2018 2259  INR 1.45 1.54   Cardiac Enzymes: Recent Labs  Lab 02/04/2018 1457 01/22/2018 1705 01/26/2018 2256 01/25/18 0503 01/25/18 1053  TROPONINI 0.03* 0.29* 1.90* 1.92* 1.29*   HbA1C: No results found for: HGBA1C CBG: Recent Labs  Lab 01/25/18 2334 01/26/18 0007 01/26/18 0358 01/26/18 0422 01/26/18 0743  GLUCAP 54* 110* 69* 126* 92   Critical care  x45 minutes Erick Colace ACNP-BC [place] Pager # [phone] OR # [phone] if no answer

## 2018-01-26 NOTE — Progress Notes (Signed)
Nutrition Consult/Follow Up  DOCUMENTATION CODES:   Not applicable  INTERVENTION:    Initiate Vital AF 1.2 at goal rate of 50 ml/h (1200 ml per day)  Prostat liquid protein 30 ml TID  Provides 1740 kcals, 135 gm protein, 973 ml free water daily  NUTRITION DIAGNOSIS:   Inadequate oral intake related to inability to eat as evidenced by NPO status, ongoing  GOAL:   Patient will meet greater than or equal to 90% of their needs, progressing   MONITOR:   Vent status, Labs, Skin, Weight trends, I & O's  ASSESSMENT:   72 yo male admitted post V.fib arrest in setting of known ischemic cardiomyopathy with EF 10%, and started on TTM, acute respiratory failure post arrest requiring intubation. PMH includes HTN, DM, OSA  Patient is currently intubated on ventilator support MV: 9.1 L/min Temp (24hrs), Avg:92.8 F (33.8 C), Min:88.9 F (31.6 C), Max:98.2 F (36.8 C)  OGT in place  RD consulted for TF initiation & management. TTM protocol discontinued this AM. He is now rewarming. Patient not following commands. Probable anoxic brain injury.  Medications reviewed. Nimbex discontinued. Labs reviewed. K 5.2 (H). BUN 48 (H). CBG's [id number].  Diet Order:   Diet Order    None     EDUCATION NEEDS:   Not appropriate for education at this time  Skin:  Skin Assessment: Reviewed RN Assessment  Last BM:  9/16    Intake/Output Summary (Last 24 hours) at 01/26/2018 1003 Last data filed at 01/26/2018 0900 Gross per 24 hour  Intake 4426.14 ml  Output 425 ml  Net 4001.14 ml   Height:   Ht Readings from Last 1 Encounters:  01/23/2018 5\' 9"  (1.753 m)   Weight:   Wt Readings from Last 1 Encounters:  01/26/18 87.7 kg   Ideal Body Weight:  72.7 kg  BMI:  Body mass index is 28.55 kg/m.  Estimated Nutritional Needs (using 37 degrees C):  Kcal:  1769  Protein:  125-140 gm  Fluid:  per MD  Arthur Holms, RD, LDN Pager #: [phone] After-Hours Pager #: [phone]

## 2018-01-26 NOTE — Plan of Care (Signed)
Right leg and foot rotate outward, will order prevalon boots

## 2018-01-26 NOTE — Progress Notes (Signed)
  CRITICAL VALUE ALERT  Critical Value:  Lactic Acid 3.4  Date & Time Notied:  01/26/2018 1015  Provider Notified: Marni Griffon, NP  Orders Received/Actions taken: no new orders received, will continue to monitor.

## 2018-01-26 NOTE — Progress Notes (Signed)
LTM EEG started. No skin breakdown noted on hookup.

## 2018-01-26 NOTE — Progress Notes (Signed)
Pt transported to and from 2H11 to CT2 on ventilator. Pt stable throughout with no complications. VS within normal limits.

## 2018-01-26 NOTE — Consult Note (Addendum)
Neurology Consultation  Reason for Consult: prognostication Referring Physician: Crim   History is obtained from:Chart  HPI: Andrew Lewis is a 72 y.o. male with history of pulmonary embolism, prolonged QT interval, nonsustained ventricular tachycardia, hypertension, hyperlipidemia, heart failure with a reduced ejection fraction, Diabetic retinopathy, Diabetic neuropathy, Diabetes, Chronic kidney disease stage III-- "brought to [facility] ED following a V-fib arrest. Per the patient's wife, the were en route back to their home in [place], [place] from [place] and stopped at [employer] in [place]. After eating and getting back into their car, the patient suddenly became unresponsive without a prior complaint of chest pain, SOB or noticeable lateralizing signs or symptoms. EMS was called, arrived within 15 min and found the patient apneic and pulseless in V-fib arrest. He received shock x 3, epi x 2 and arrived on Epi drip; Amiodarone 352m was also given prior to arrival. [facility] reportedly had intermittent [facility] initial ROS[facility] of 7 min. ECG post arrival without evidence of STEMI and TnI was 0.03.  The wife states that the patient has a hx HTN, DM and an arrhythmia in the past, for which he was on anticoagulants. [place]  Patient was put on the cooling protocol and currently being rewarmed today  9/16 patient [facility] Started last night. CT Head: 9/15: neg EEG 9/16: burst suppression. No seizure  Echocardiogram 9/16:Ejection fraction 20 to 25% cavity mildly dilated systolic function severely reduced EEG 9/17 shows G peds  Initial labs: - Glucose 110 -BUN 35 -Creatinine 1.69 - AST 26 -ALT 17 -Alkaline phosphatase 135 - GFR 45 -Creatinine clearance 26   ROS: A 14 point ROS was performed and is negative except as noted in the HPI.   Past Medical History:  Diagnosis Date  . Anemia  of chronic disease   . Chronic kidney disease (CKD), stage III (moderate) (HCC)   . Diabetes mellitus with complication ([place]   . Diabetic neuropathy ([facility] [place]   . Diabetic retinopathy ([place]   . 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 HFrEF (heart failure with reduced ejection fraction) ([place]    a. NICM with an EF of EF of 10-15%, mild concentric LVH, Gr3DD, moderately dilated LV cavity, mildly enlarged RV with mildly reduced RVSF, severe biatrial enlargement, mild mitral annular calcification, moderate to severe MR/TR, moderately elevated PASP; b. previously declined ICD  . History of nuclear stress test    a. 2011: negative for ischemia  . HLD (hyperlipidemia)   . HTN (hypertension)   . MRSA (methicillin resistant staph aureus) culture positive   . NSVT (nonsustained ventricular tachycardia) ([place]   . Prolonged Q-T interval on ECG   . Pulmonary embolism ([place] 2013   a. tx'd w/ Coumadin    Not bale to obtain family history at this time  Social History:   reports that he has quit smoking. He has never used smokeless tobacco. His alcohol and drug histories are not on file.  Medications  Current Facility-Administered Medications:  .  0.9 %  sodium chloride infusion, , Intravenous, Continuous, ALacretia Leigh MD, Last Rate: 10 mL/hr at 01/25/18 1900 .  Place/Maintain arterial line, , , Until Discontinued **AND** 0.9 %  sodium chloride infusion, , Intra-arterial, PRN, ALacretia Leigh MD .  ampicillin-sulbactam (UNASYN) 1.5 g in sodium chloride 0.9 % 100 mL IVPB, 1.5 g, Intravenous, Q12H, BErick Colace NP, Last Rate: 200 mL/hr at 01/26/18 0905,  1.5 g at 01/26/18 0905 .  artificial tears (LACRILUBE) ophthalmic ointment 1 application, 1 application, Both Eyes, Q8H, Crim, Courtney, MD, 1 application at 44/03/47 0518 .  chlorhexidine gluconate (MEDLINE KIT) (PERIDEX) 0.12 % solution 15 mL, 15 mL, Mouth Rinse, BID, Crim, Courtney, MD, 15 mL at 01/26/18 0833 .  Chlorhexidine Gluconate Cloth 2 % PADS 6  each, 6 each, Topical, Daily, Jamesetta So, MD, 6 each at 01/26/18 0522 .  famotidine (PEPCID) 40 MG/5ML suspension 20 mg, 20 mg, Per Tube, BID, Crim, Courtney, MD, 20 mg at 01/26/18 0930 .  feeding supplement (PRO-STAT SUGAR FREE 64) liquid 30 mL, 30 mL, Per Tube, BID, Salvadore Dom E, NP, 30 mL at 01/26/18 0930 .  feeding supplement (VITAL HIGH PROTEIN) liquid 1,000 mL, 1,000 mL, Per Tube, Q24H, Salvadore Dom E, NP .  fentaNYL (SUBLIMAZE) bolus via infusion 25 mcg, 25 mcg, Intravenous, Q30 min PRN, Crim, Courtney, MD .  fentaNYL 2565mg in NS 2520m(1072mml) infusion-PREMIX, 100-300 mcg/hr, Intravenous, Continuous, Crim, Courtney, MD, Last Rate: 25 mL/hr at 01/26/18 0800, 250 mcg/hr at 01/26/18 0800 .  heparin injection 5,000 Units, 5,000 Units, Subcutaneous, Q8H, Crim, Courtney, MD, 5,000 Units at 01/26/18 0517 .  insulin aspart (novoLOG) injection 2-6 Units, 2-6 Units, Subcutaneous, Q4H, Crim, Courtney, MD .  MEDLINE mouth rinse, 15 mL, Mouth Rinse, 10 times per day, Crim, CouLoma SousaD, 15 mL at 01/26/18 0518 .  midazolam (VERSED) 50 mg in sodium chloride 0.9 % 50 mL (1 mg/mL) infusion, 2-10 mg/hr, Intravenous, Continuous, Crim, Courtney, MD, Last Rate: 3 mL/hr at 01/26/18 0800, 3 mg/hr at 01/26/18 0800 .  midazolam (VERSED) bolus via infusion 1 mg, 1 mg, Intravenous, Q30 min PRN, Crim, Courtney, MD .  norepinephrine (LEVOPHED) 16 mg in D5W 250m33memix infusion, 0-50 mcg/min, Intravenous, Titrated, Crim, Courtney, MD, Last Rate: 16.88 mL/hr at 01/26/18 0800, 18 mcg/min at 01/26/18 0800 .  pneumococcal 23 valent vaccine (PNU-IMMUNE) injection 0.5 mL, 0.5 mL, Intramuscular, Prior to discharge, MillJesus Genera .  sodium bicarbonate 150 mEq in dextrose 5 % 1,000 mL infusion, , Intravenous, Continuous, BabcErick Colace, Last Rate: 75 mL/hr at 01/26/18 0928 .  sodium chloride flush (NS) 0.9 % injection 10-40 mL, 10-40 mL, Intracatheter, Q12H, Crim, Courtney, MD, 10 mL at 01/25/18 2159 .   sodium chloride flush (NS) 0.9 % injection 10-40 mL, 10-40 mL, Intracatheter, PRN, CrimJamesetta So   Exam: Current vital signs: BP 114/69   Pulse 65   Temp 98.2 F (36.8 C) (Esophageal)   Resp (!) 24   Ht _0  (1.753 m)   Wt 87.7 kg   SpO2 98%   BMI 28.55 kg/m  Vital signs in last 24 hours: Temp:  [88.9 F (31.6 C)-98.2 F (36.8 C)] 98.2 F (36.8 C) (09/17 0900) Pulse Rate:  [51-83] 65 (09/17 0915) Resp:  [16-24] 24 (09/17 0915) BP: (93-139)/(66-83) 114/69 (09/17 0900) SpO2:  [92 %-100 %] 98 % (09/17 0915) Arterial Line BP: (99-158)/(46-82) 141/70 (09/17 0915) FiO2 (%):  [40 %] 40 % (09/17 0830) Weight:  [87.7 kg] 87.7 kg (09/17 0400)  GENERAL: Awake, alert in NAD HEENT: - Normocephalic and atraumatic, Ext: warm, well perfused, intact peripheral pulses,   Mental Status: Patient does not respond to verbal stimuli.  Does not respond to deep sternal rub.  Does not follow commands.  No verbalizations noted.  Intubated on Versed and fentanyl.  Patient currently has continuous blinking along with chewing on the intubation tube.  Patient is  not breathing over the ventilator which is set at 16 breaths/min Cranial Nerves: II: patient does not respond confrontation bilaterally,  III,IV,VI: doll's response absent bilaterally. pupils right 2 mm, left 2 mm,and nonreactive bilaterally V,VII: corneal reflex absent VIII: patient does not respond to verbal stimuli IX,X: gag reflex absent, XI: trapezius strength unable to test bilaterally XII: tongue strength unable to test Motor: Extremities flaccid throughout.  No spontaneous movement noted.  No purposeful movements noted. Sensory: Does not respond to noxious stimuli in any extremity. Deep Tendon Reflexes:  Absent throughout lower extremities with 2+ in the upper extremities Plantars: absent bilaterally Cerebellar: Unable to perform*   Labs I have reviewed labs in epic and the results pertinent to this consultation  are:   CBC    Component Value Date/Time   WBC 3.0 (L) 01/25/2018 0503   RBC 4.28 01/25/2018 0503   HGB 11.1 (L) 01/25/2018 0503   HCT 34.5 (L) 01/25/2018 0503   PLT 144 (L) 01/25/2018 0503   MCV 80.6 01/25/2018 0503   MCH 25.9 (L) 01/25/2018 0503   MCHC 32.2 01/25/2018 0503   RDW 16.2 (H) 01/25/2018 0503   LYMPHSABS 5.7 (H) 02/03/2018 1457   MONOABS 0.2 02/04/2018 1457   EOSABS 0.0 01/20/2018 1457   BASOSABS 0.0 01/14/2018 1457    CMP     Component Value Date/Time   NA 137 01/26/2018 0739   K 5.2 (H) 01/26/2018 0739   CL 108 01/26/2018 0739   CO2 15 (L) 01/26/2018 0739   GLUCOSE 94 01/26/2018 0739   BUN 48 (H) 01/26/2018 0739   CREATININE 2.77 (H) 01/26/2018 0739   CALCIUM 7.5 (L) 01/26/2018 0739   PROT 5.5 (L) 02/02/2018 1457   ALBUMIN 2.0 (L) 01/30/2018 1457   AST 26 01/29/2018 1457   ALT 17 01/23/2018 1457   ALKPHOS 135 (H) 02/06/2018 1457   BILITOT 1.1 01/20/2018 1457   GFRNONAA 21 (L) 01/26/2018 0739   GFRAA 25 (L) 01/26/2018 0739    Lipid Panel     Component Value Date/Time   CHOL 107 01/10/2018 1505   TRIG 72 01/26/2018 1505   HDL 34 (L) 02/05/2018 1505   CHOLHDL 3.1 01/16/2018 1505   VLDL 14 01/14/2018 1505   LDLCALC 59 01/16/2018 1505     Imaging I have reviewed the images obtained:  CT-scan of the brain--obtained on 01/25/2008 shows no acute brain injury but some subtle low-attenuation over the left cerebral hemisphere which may be due to prominent CSF space versus acute subacute ischemic change.   NEUROHOSPITALIST ADDENDUM Performed a face to face diagnostic evaluation.   I have reviewed the contents of history and physical exam as documented by PA/ARNP/Resident and agree with above documentation.  I have discussed and formulated the  plan as documented below. Edits to the note have been made as needed.   ASSESSMENT AND PLAN  72 y.o. male with history of pulmonary embolism, prolonged QT interval, nonsustained ventricular tachycardia,  hypertension, hyperlipidemia, heart failure with a reduced ejection fraction with Vfib arrest s/p hypothermia protocol.   Anoxic Brain Injury   Stat EEG shows Generalized periodic epileptiform discharges with poor background.  Loaded with Valproic Acid and started on VPA 536m BID LTM EEG to monitor for non convulsive seizures, if no seizures, will d/c tomorrow  Pattern of EEG - showing GPEDS usually suggestive of  poor prognosis.  Repeat CT head: does not show diffuse cerebral edema       This patient is neurologically critically ill due to  an anoxic injury status post cardiac arrest. This patient's care requires constant monitoring of vital signs, hemodynamics, respiratory and cardiac monitoring, review of multiple databases, neurological assessment, discussion with family, other specialists and medical decision making of high complexity.  I spent 50  minutes of neurocritical time in the care of this patient.     Karena Addison Aroor MD [facility] [phone]   If 7pm to 7am, please call on call as listed on AMION.

## 2018-01-26 NOTE — Procedures (Signed)
ELECTROENCEPHALOGRAM REPORT   Patient: Andrew Lewis       Room #: [facility] EEG No. ID: [id number] Age: 72 y.o.        Sex: male Referring Physician: Crim Report Date:  01/26/2018        Interpreting Physician: Alexis Goodell  History: Olander Friedl is an 72 y.o. male s/p arrest  Medications:  Unasyn, Pepcid, Insulin, Fentanyl, Versed, Levophed  Conditions of Recording:  This is a 21 channel routine scalp EEG performed with bipolar and monopolar montages arranged in accordance to the international 10/20 system of electrode placement. One channel was dedicated to EKG recording.  The patient is in the intubated and sedated state.  Description:  The background activity is markedly attenuated.  There are superimposed generalized, periodic epileptiform discharges (GPEDs).  These are continuous throughout the recording and occur at a frequency of approximately one hertz.  The patient has continuous eye blinking during the recording.  The eye blinking occurs with each epileptiform discharge.  This eye blinking does not abate with active eye opening.   Hyperventilation and intermittent photic stimulation were not performed.   IMPRESSION: This is an abnormal electroencephalogram secondary to GPEDs superimposed on a markedly attenuated background.  Discharges correlate with clinical activity of eye blinking.    Alexis Goodell, MD Neurology [phone] 01/26/2018, 9:33 AM

## 2018-01-26 NOTE — Progress Notes (Signed)
Went to CT with patient via bed, vent, and monitor.

## 2018-01-26 NOTE — Progress Notes (Signed)
Progress Note  Patient Name: Andrew Lewis Date of Encounter: 01/26/2018  Primary Cardiologist: No primary care provider on file. Dr. Ashby Dawes [facility] in [place], [place])  Subjective   Intubated and sedated.   Inpatient Medications    Scheduled Meds: . artificial tears  1 application Both Eyes Z6X  . chlorhexidine gluconate (MEDLINE KIT)  15 mL Mouth Rinse BID  . Chlorhexidine Gluconate Cloth  6 each Topical Daily  . famotidine  20 mg Per Tube BID  . heparin  5,000 Units Subcutaneous Q8H  . Influenza vac split quadrivalent PF  0.5 mL Intramuscular Tomorrow-1000  . insulin aspart  2-6 Units Subcutaneous Q4H  . insulin detemir  5 Units Subcutaneous Q12H  . mouth rinse  15 mL Mouth Rinse 10 times per day  . sodium chloride flush  10-40 mL Intracatheter Q12H   Continuous Infusions: . sodium chloride 10 mL/hr at 01/25/18 1900  . sodium chloride    . sodium chloride 100 mL/hr at 01/26/18 0600  . amiodarone 30 mg/hr (01/26/18 0600)  . cisatracurium (NIMBEX) infusion 1 mcg/kg/min (01/26/18 0600)  . epinephrine 0.5 mcg/min (01/27/2018 1503)  . fentaNYL infusion INTRAVENOUS 250 mcg/hr (01/26/18 0600)  . midazolam (VERSED) infusion 3 mg/hr (01/26/18 0600)  . norepinephrine (LEVOPHED) Adult infusion 15 mcg/min (01/26/18 0600)   PRN Meds: Place/Maintain arterial line **AND** sodium chloride, [COMPLETED] cisatracurium **AND** cisatracurium (NIMBEX) infusion **AND** cisatracurium, fentaNYL, midazolam, pneumococcal 23 valent vaccine, sodium chloride flush   Vital Signs    Vitals:   01/26/18 0700 01/26/18 0715 01/26/18 0730 01/26/18 0745  BP: 98/66     Pulse:  82 83 83  Resp: 16 17 16 16   Temp:      TempSrc:      SpO2:  100% 100% 100%  Weight:      Height:        Intake/Output Summary (Last 24 hours) at 01/26/2018 0800 Last data filed at 01/26/2018 0600 Gross per 24 hour  Intake 4323.68 ml  Output 440 ml  Net 3883.68 ml   Filed Weights   02/07/2018 1631 01/25/18  0300 01/26/18 0400  Weight: 91.8 kg 83.9 kg 87.7 kg    Telemetry    Sinus, rare ectopy - Personally Reviewed  ECG    No AM EKG - Personally Reviewed  Physical Exam   General: intubated, sedated.  HEENT: OP clear, mucus membranes moist  SKIN: warm, dry. No rashes. Neuro: sedated Musculoskeletal: unable to assess Psychiatric: Mood and affect normal  Neck: No JVD, no carotid bruits, no thyromegaly, no lymphadenopathy.  Lungs:Clear bilaterally, no wheezes, rhonci, crackles Cardiovascular: Regular rate and rhythm. No murmurs, gallops or rubs. Abdomen:Soft. Bowel sounds present. Non-tender.  Extremities: No lower extremity edema.    Labs    Chemistry Recent Labs  Lab 01/28/2018 1457  01/25/18 1943 01/25/18 2347 01/26/18 0353  NA 140   < > 136 138 136  K 3.8   < > 4.5 4.7 4.9  CL 111   < > 107 112* 106  CO2 15*   < > 14* 15* 14*  GLUCOSE 110*   < > 89 76 86  Lewis 35*   < > 47* 47* 45*  CREATININE 1.69*   < > 2.49* 2.57* 2.63*  CALCIUM 6.7*   < > 7.3* 7.2* 7.4*  PROT 5.5*  --   --   --   --   ALBUMIN 2.0*  --   --   --   --   AST 26  --   --   --   --  ALT 17  --   --   --   --   ALKPHOS 135*  --   --   --   --   BILITOT 1.1  --   --   --   --   GFRNONAA 39*   < > 24* 23* 23*  GFRAA 45*   < > 28* 27* 26*  ANIONGAP 14   < > 15 11 16*   < > = values in this interval not displayed.     Hematology Recent Labs  Lab 01/13/2018 1457  01/25/18 0208 01/25/18 0310 01/25/18 0503  WBC 7.0  --   --   --  3.0*  RBC 4.32  --   --   --  4.28  HGB 11.1*   < > 11.9* 11.9* 11.1*  HCT 36.6*   < > 35.0* 35.0* 34.5*  MCV 84.7  --   --   --  80.6  MCH 25.7*  --   --   --  25.9*  MCHC 30.3  --   --   --  32.2  RDW 16.6*  --   --   --  16.2*  PLT 131*  --   --   --  144*   < > = values in this interval not displayed.    Cardiac Enzymes Recent Labs  Lab 01/15/2018 1705 01/10/2018 2256 01/25/18 0503 01/25/18 1053  TROPONINI 0.29* 1.90* 1.92* 1.29*    Recent Labs  Lab  02/02/2018 1446  TROPIPOC 0.01     BNPNo results for input(s): BNP, PROBNP in the last 168 hours.   DDimer No results for input(s): DDIMER in the last 168 hours.   Radiology    Ct Head Wo Contrast  Result Date: 01/12/2018 CLINICAL DATA:  Post cardiac arrest today. EXAM: CT HEAD WITHOUT CONTRAST TECHNIQUE: Contiguous axial images were obtained from the base of the skull through the vertex without intravenous contrast. COMPARISON:  None. FINDINGS: Brain: Ventricles, cisterns and other CSF spaces are within normal. There is chronic ischemic microvascular disease. There is no mass, mass effect, shift of midline structures or acute hemorrhage. Subtle low-attenuation over the left cerebellar hemisphere not seen on the coronal or sagittal images. This may represent averaging through prominent CSF space versus acute to subacute ischemic change. Vascular: No hyperdense vessel or unexpected calcification. Skull: Normal. Negative for fracture or focal lesion. Sinuses/Orbits: No acute finding. Other: None. IMPRESSION: No acute brain injury. Subtle low-attenuation over the left cerebellar hemisphere which may be due prominent CSF space versus acute subacute ischemic change. Chronic ischemic microvascular disease. Electronically Signed   By: Marin Olp M.D.   On: 02/07/2018 17:58   Dg Chest Port 1 View  Result Date: 01/26/2018 CLINICAL DATA:  Central line placement EXAM: PORTABLE CHEST 1 VIEW COMPARISON:  01/11/2018 FINDINGS: Endotracheal tube tip is about 4.6 cm superior to the carina. Esophageal tube tip is below the diaphragm but non included. Left IJ central venous catheter tip projects over the venous confluence. No left pneumothorax. Cardiomegaly with small pleural effusion, right greater than left and hazy atelectasis or edema at the right base. Vascular congestion. IMPRESSION: 1. Left-sided central venous catheter tip overlies the venous confluence. No pneumothorax 2. Cardiomegaly with vascular  congestion and continued right pleural effusion and hazy atelectasis or edema at the right base. Electronically Signed   By: Donavan Foil M.D.   On: 02/02/2018 23:44   Dg Chest Portable 1 View  Result Date: 01/15/2018 CLINICAL DATA:  Status post endotracheal tube placement and CPR EXAM: PORTABLE CHEST 1 VIEW COMPARISON:  None. FINDINGS: Cardiac shadow is prominent. Endotracheal tube and nasogastric catheter are noted in satisfactory position. Right-sided pleural effusion is noted with right basilar atelectasis. No acute bony abnormality is noted. IMPRESSION: Tubes and lines in satisfactory position. Right pleural effusion with basilar atelectasis. Electronically Signed   By: Inez Catalina M.D.   On: 01/23/2018 15:14    Cardiac Studies   Echo 01/25/18: - Left ventricle: The cavity size was moderately dilated. Systolic   function was severely reduced. The estimated ejection fraction   was in the range of 20% to 25%. Diffuse hypokinesis. The study is   not technically sufficient to allow evaluation of LV diastolic   function. - Aortic valve: Trileaflet; mildly thickened leaflets. There was   mild regurgitation. - Mitral valve: Calcified annulus. Mildly thickened leaflets .   There was moderate regurgitation. - Left atrium: The atrium was moderately to severely dilated.   Volume/bsa, ES, (1-plane Simpson&'s, A2C): 49.6 ml/m^2. - Right ventricle: Systolic function was moderately reduced. - Right atrium: The atrium was moderately dilated. - Tricuspid valve: There was moderate regurgitation. - Pulmonary arteries: Systolic pressure was moderately increased.   PA peak pressure: 54 mm Hg (S).  Patient Profile     72 y.o. male with known NICM with LVEF=10% by echo 01/21/18, CKD, prior PE on  Coumadin, HTN, HLD admitted following out of hospital VF arrest. Pt is followed by cardiology in [place], [place] and has refused ICD in the past.   Assessment & Plan    1. NICM/Ventricular fibrillation arrest: He  is followed for a dilated NICM in [place]. He has refused ICD in the past. Now admitted with a VF arrest. Flat troponin trend with mild elevation, not suggestive of ACS. He has completed the hypothermia protocol. Echo 01/25/18 with LVEF=20-25%. This is unchanged from prior echo. Will continue supportive care. If he recovers neurologically, will consider cardiac cath and EP consult for ICD.    2. NSVT: NO recurrence over last 24 hours. Given prolonged QT, will stop IV amiodarone.   For questions or updates, please contact [place] Please consult [email or web address] for contact info under        Signed, Lauree Chandler, MD  01/26/2018, 8:00 AM

## 2018-01-26 NOTE — Plan of Care (Signed)
Patients right leg does twist outward d/t prior right hip replacement. Patient uses walker for ambulation

## 2018-01-26 NOTE — Progress Notes (Signed)
EEG complete - results pending 

## 2018-01-27 ENCOUNTER — Inpatient Hospital Stay (HOSPITAL_COMMUNITY): Payer: Medicare HMO

## 2018-01-27 DIAGNOSIS — G931 Anoxic brain damage, not elsewhere classified: Secondary | ICD-10-CM

## 2018-01-27 LAB — COMPREHENSIVE METABOLIC PANEL
ALBUMIN: 1.6 g/dL — AB (ref 3.5–5.0)
ALK PHOS: 130 U/L — AB (ref 38–126)
ALK PHOS: 124 U/L (ref 38–126)
ALT: 92 U/L — ABNORMAL HIGH (ref 0–44)
ALT: 104 U/L — ABNORMAL HIGH (ref 0–44)
ALT: 111 U/L — ABNORMAL HIGH (ref 0–44)
ANION GAP: 13 (ref 5–15)
ANION GAP: 13 (ref 5–15)
AST: 180 U/L — ABNORMAL HIGH (ref 15–41)
AST: 177 U/L — AB (ref 15–41)
AST: 176 U/L — ABNORMAL HIGH (ref 15–41)
Albumin: 1.7 g/dL — ABNORMAL LOW (ref 3.5–5.0)
Albumin: 1.6 g/dL — ABNORMAL LOW (ref 3.5–5.0)
Alkaline Phosphatase: 131 U/L — ABNORMAL HIGH (ref 38–126)
Anion gap: 11 (ref 5–15)
BILIRUBIN TOTAL: 1 mg/dL (ref 0.3–1.2)
BILIRUBIN TOTAL: 1 mg/dL (ref 0.3–1.2)
BUN: 51 mg/dL — ABNORMAL HIGH (ref 8–23)
BUN: 55 mg/dL — AB (ref 8–23)
BUN: 56 mg/dL — ABNORMAL HIGH (ref 8–23)
CALCIUM: 6.9 mg/dL — AB (ref 8.9–10.3)
CO2: 19 mmol/L — ABNORMAL LOW (ref 22–32)
CO2: 24 mmol/L (ref 22–32)
CO2: 22 mmol/L (ref 22–32)
CREATININE: 3.03 mg/dL — AB (ref 0.61–1.24)
CREATININE: 3.27 mg/dL — AB (ref 0.61–1.24)
Calcium: 6.7 mg/dL — ABNORMAL LOW (ref 8.9–10.3)
Calcium: 6.9 mg/dL — ABNORMAL LOW (ref 8.9–10.3)
Chloride: 107 mmol/L (ref 98–111)
Chloride: 105 mmol/L (ref 98–111)
Chloride: 104 mmol/L (ref 98–111)
Creatinine, Ser: 3.33 mg/dL — ABNORMAL HIGH (ref 0.61–1.24)
GFR calc Af Amer: 20 mL/min — ABNORMAL LOW (ref >60)
GFR calc non Af Amer: 17 mL/min — ABNORMAL LOW (ref >60)
GFR, EST AFRICAN AMERICAN: 22 mL/min — AB (ref >60)
GFR, EST AFRICAN AMERICAN: 20 mL/min — AB (ref >60)
GFR, EST NON AFRICAN AMERICAN: 19 mL/min — AB (ref >60)
GFR, EST NON AFRICAN AMERICAN: 17 mL/min — AB (ref >60)
GLUCOSE: 160 mg/dL — AB (ref 70–99)
Glucose, Bld: 114 mg/dL — ABNORMAL HIGH (ref 70–99)
Glucose, Bld: 140 mg/dL — ABNORMAL HIGH (ref 70–99)
POTASSIUM: 3.9 mmol/L (ref 3.5–5.1)
Potassium: 4.3 mmol/L (ref 3.5–5.1)
Potassium: 4 mmol/L (ref 3.5–5.1)
SODIUM: 139 mmol/L (ref 135–145)
Sodium: 140 mmol/L (ref 135–145)
Sodium: 139 mmol/L (ref 135–145)
TOTAL PROTEIN: 5.1 g/dL — AB (ref 6.5–8.1)
TOTAL PROTEIN: 5.3 g/dL — AB (ref 6.5–8.1)
Total Bilirubin: 1.1 mg/dL (ref 0.3–1.2)
Total Protein: 5.1 g/dL — ABNORMAL LOW (ref 6.5–8.1)

## 2018-01-27 LAB — CBC WITH DIFFERENTIAL/PLATELET
BASOS ABS: 0.1 10*3/uL (ref 0.0–0.1)
Basophils Relative: 1 %
Eosinophils Absolute: 1.3 10*3/uL — ABNORMAL HIGH (ref 0.0–0.7)
Eosinophils Relative: 24 %
HCT: 32.2 % — ABNORMAL LOW (ref 39.0–52.0)
HEMOGLOBIN: 10.4 g/dL — AB (ref 13.0–17.0)
LYMPHS PCT: 17 %
Lymphs Abs: 0.9 10*3/uL (ref 0.7–4.0)
MCH: 25.7 pg — ABNORMAL LOW (ref 26.0–34.0)
MCHC: 32.3 g/dL (ref 30.0–36.0)
MCV: 79.7 fL (ref 78.0–100.0)
MONO ABS: 0.1 10*3/uL (ref 0.1–1.0)
Monocytes Relative: 1 %
NEUTROS PCT: 57 %
Neutro Abs: 3.1 10*3/uL (ref 1.7–7.7)
PLATELETS: 136 10*3/uL — AB (ref 150–400)
RBC: 4.04 MIL/uL — ABNORMAL LOW (ref 4.22–5.81)
RDW: 16.5 % — ABNORMAL HIGH (ref 11.5–15.5)
WBC MORPHOLOGY: INCREASED
WBC: 5.5 10*3/uL (ref 4.0–10.5)

## 2018-01-27 LAB — POCT I-STAT 3, ART BLOOD GAS (G3+)
ACID-BASE DEFICIT: 1 mmol/L (ref 0.0–2.0)
ACID-BASE DEFICIT: 6 mmol/L — AB (ref 0.0–2.0)
Acid-base deficit: 2 mmol/L (ref 0.0–2.0)
BICARBONATE: 18 mmol/L — AB (ref 20.0–28.0)
Bicarbonate: 21.8 mmol/L (ref 20.0–28.0)
Bicarbonate: 24.7 mmol/L (ref 20.0–28.0)
O2 SAT: 98 %
O2 Saturation: 99 %
O2 Saturation: 99 %
PCO2 ART: 31.5 mmHg — AB (ref 32.0–48.0)
PH ART: 7.448 (ref 7.350–7.450)
TCO2: 19 mmol/L — AB (ref 22–32)
TCO2: 23 mmol/L (ref 22–32)
TCO2: 26 mmol/L (ref 22–32)
pCO2 arterial: 29.2 mmHg — ABNORMAL LOW (ref 32.0–48.0)
pCO2 arterial: 44.4 mmHg (ref 32.0–48.0)
pH, Arterial: 7.354 (ref 7.350–7.450)
pH, Arterial: 7.398 (ref 7.350–7.450)
pO2, Arterial: 120 mmHg — ABNORMAL HIGH (ref 83.0–108.0)
pO2, Arterial: 122 mmHg — ABNORMAL HIGH (ref 83.0–108.0)
pO2, Arterial: 126 mmHg — ABNORMAL HIGH (ref 83.0–108.0)

## 2018-01-27 LAB — GLUCOSE, CAPILLARY
GLUCOSE-CAPILLARY: 103 mg/dL — AB (ref 70–99)
GLUCOSE-CAPILLARY: 104 mg/dL — AB (ref 70–99)
GLUCOSE-CAPILLARY: 109 mg/dL — AB (ref 70–99)
GLUCOSE-CAPILLARY: 117 mg/dL — AB (ref 70–99)
GLUCOSE-CAPILLARY: 95 mg/dL (ref 70–99)
Glucose-Capillary: 98 mg/dL (ref 70–99)

## 2018-01-27 LAB — URINE CULTURE: CULTURE: NO GROWTH

## 2018-01-27 LAB — LACTIC ACID, PLASMA: Lactic Acid, Venous: 1.8 mmol/L (ref 0.5–1.9)

## 2018-01-27 LAB — MAGNESIUM
MAGNESIUM: 2.1 mg/dL (ref 1.7–2.4)
Magnesium: 1.7 mg/dL (ref 1.7–2.4)

## 2018-01-27 LAB — PHOSPHORUS
PHOSPHORUS: 3.9 mg/dL (ref 2.5–4.6)
Phosphorus: 4.1 mg/dL (ref 2.5–4.6)

## 2018-01-27 MED ORDER — ASPIRIN 81 MG PO CHEW
81.0000 mg | CHEWABLE_TABLET | Freq: Every day | ORAL | Status: DC
  Administered 2018-01-27 – 2018-01-31 (×5): 81 mg
  Filled 2018-01-27 (×6): qty 1

## 2018-01-27 MED ORDER — MAGNESIUM SULFATE 2 GM/50ML IV SOLN
2.0000 g | Freq: Once | INTRAVENOUS | Status: AC
  Administered 2018-01-27: 2 g via INTRAVENOUS
  Filled 2018-01-27: qty 50

## 2018-01-27 NOTE — Progress Notes (Signed)
Reason for consult:   Subjective: Patient on sedation with Midozolam and fentanyl. No longer having rhythmic twitching movements.    ROS: Unable to obtain due to poor mental status  Examination  Vital signs in last 24 hours: Temp:  [98.2 F (36.8 C)-98.8 F (37.1 C)] 98.6 F (37 C) (09/18 1200) Pulse Rate:  [77-105] 79 (09/18 1530) Resp:  [10-43] 12 (09/18 1530) BP: (97-150)/(56-91) 150/67 (09/18 1515) SpO2:  [96 %-100 %] 100 % (09/18 1530) Arterial Line BP: (96-179)/(48-99) 132/53 (09/18 1530) FiO2 (%):  [30 %] 30 % (09/18 1515) Weight:  [95.6 kg] 95.6 kg (09/18 0500)  General: Not in distress, cooperative CVS: pulse-normal rate and rhythm RS: breathing comfortably Extremities: normal   Neuro: Mental Status: Patient does not respond to verbal stimuli.  Does not respond to deep sternal rub.  Does not follow commands.  No verbalizations noted.  Cranial Nerves: II: Pupils not reactive, 43mm bilaterally III,IV,VI: doll's response absent bilaterally.  V,VII: corneal reflex: absent VIII: patient does not respond to verbal stimuli IX,X: cough reflex +  XI: trapezius strength unable to test bilaterally XII: tongue strength unable to test Motor: Extremities flaccid throughout.  No spontaneous movement noted.  No purposeful movements noted.  Sensory: Does not respond to noxious stimuli in any extremity. Deep Tendon Reflexes: Right biceps 2+ , left biceps and patellar reflexes absent  Plantars: mute  Cerebellar: Unable to perform  Basic Metabolic Panel: Recent Labs  Lab 02/04/2018 1705  01/25/18 0503  01/26/18 0353 01/26/18 0739 01/26/18 0915 01/26/18 1157 01/26/18 1540 01/27/18 0431 01/27/18 1235  NA  --    < > 136   < > 136 137  --  138  --  139 140  K  --    < > 4.4   < > 4.9 5.2*  --  5.4*  --  4.3 4.0  CL  --    < > 107   < > 106 108  --  108  --  107 105  CO2  --   --  16*   < > 14* 15*  --  15*  --  19* 24  GLUCOSE  --    < > 140*   < > 86 94  --  88  --  114*  140*  BUN  --    < > 44*   < > 45* 48*  --  49*  --  51* 55*  CREATININE  --    < > 2.36*   < > 2.63* 2.77*  --  2.93*  --  3.03* 3.27*  CALCIUM  --   --  7.6*   < > 7.4* 7.5*  --  7.2*  --  6.7* 6.9*  MG 1.7  --  2.0  --   --   --  1.9  --  1.8 1.7  --   PHOS  --   --  2.4*  --   --   --  4.3  --  4.3 4.1  --    < > = values in this interval not displayed.    CBC: Recent Labs  Lab 02/02/2018 1457  01/25/18 0013 01/25/18 0208 01/25/18 0310 01/25/18 0503 01/27/18 0431  WBC 7.0  --   --   --   --  3.0* 5.5  NEUTROABS 1.1*  --   --   --   --   --  3.1  HGB 11.1*   < > 11.2* 11.9* 11.9* 11.1*  10.4*  HCT 36.6*   < > 33.0* 35.0* 35.0* 34.5* 32.2*  MCV 84.7  --   --   --   --  80.6 79.7  PLT 131*  --   --   --   --  144* 136*   < > = values in this interval not displayed.     Coagulation Studies: Recent Labs    01/30/2018 2259  LABPROT 18.4*  INR 1.54    Imaging Reviewed:       ASSESSMENT AND PLAN  72 y.o. male with history of pulmonary embolism, prolonged QT interval, nonsustained ventricular tachycardia, hypertension, hyperlipidemia, heart failure with a reduced ejection fraction with Vfib arrest s/p hypothermia protocol. Repeat CT head: does not show diffuse cerebral edema  Anoxic Brain Injury   Stat EEG shows Generalized periodic epileptiform discharges with poor background.  LTM EEG showed reduced amplitude of GPDs, no seizures. Sedation weaned.   Plan D/c LTM EEG Continue Depakote Allow for  48 hrs after weaning sedation for prognostication.  If exam remains poor on Day 5, consider MRI brian.   Patient is critically ill due to anoxic injury status post cardiac arrest. I spent 30 min in the care of this patient including reviewing EEG, decision making and discussion with family.  Karena Addison Carolene Gitto [facility] Pager Number [phone] For questions after 7pm please refer to AMION to reach the Neurologist on call

## 2018-01-27 NOTE — Procedures (Signed)
Continuous Video-EEG Monitoring Report   MRN:    [id number]  Study Duration: 01/26/2018 15:52 to 01/27/2018 07:30 CPT Code:  45038 Diagnosis:  Altered mental status (R41.82)  History: This is a 72 year old male presenting with altered mental status s/p cardiac arrest.  Video-EEG monitoring was performed to evaluate for seizures.  Technical Details:  Long-term video-EEG monitoring was performed using standard setting per the guidelines.  Briefly, a minimum of 21 electrodes were placed on scalp according to the International 10-20 or/and 10-10 Systems.  Supplemental electrodes were placed as needed.  Single EKG electrode was also used to detect cardiac arrhythmia.  Patient's behavior was continuously recorded on video simultaneously with EEG.  A minimum of 16 channels were used for data display.  Each epoch of study was reviewed manually daily and as needed using standard referential and bipolar montages.    EEG Description:  There was background polymorphic delta slowing with attenuated amplitude.  No posterior dominant rhythm or sleep architecture was present.  Semi-periodic sharp waves were present in the bilateral frontotemporal region (more prominent in the temporal regions), at a frequency up to 1.8 Hz without clear evolution or motor correlate.  The sharp waves decreased in frequency and amplitude gradually.  No seizure was present.  Impression:  This is an abnormal EEG due to the presence of the following: 1) Background polymorphic delta slowing, suggesting severe encephalopathy, but medication effect (such as benzodiazepine, propofol) could not be excluded; 2) Semi-periodic epileptiform discharges in bilateral frontotemporal region (which decreased gradually), suggesting bilateral cortical irritability (which improved gradually).  No seizure was present.    Reading Physician: Winfield Cunas, MD, PhD

## 2018-01-27 NOTE — Progress Notes (Signed)
Progress Note  Patient Name: Andrew Lewis Date of Encounter: 01/27/2018  Primary Cardiologist: No primary care provider on file. Dr. Ashby [facility] in [place], [place])  Subjective   Intubated and sedated  Inpatient Medications    Scheduled Meds: . artificial tears  1 application Both Eyes B0F  . chlorhexidine gluconate (MEDLINE KIT)  15 mL Mouth Rinse BID  . Chlorhexidine Gluconate Cloth  6 each Topical Daily  . famotidine  20 mg Per Tube Daily  . feeding supplement (PRO-STAT SUGAR FREE 64)  30 mL Per Tube TID  . heparin  5,000 Units Subcutaneous Q8H  . insulin aspart  2-6 Units Subcutaneous Q4H  . mouth rinse  15 mL Mouth Rinse 10 times per day   Continuous Infusions: . sodium chloride Stopped (01/27/18 0753)  . sodium chloride    . ampicillin-sulbactam (UNASYN) IV 1.5 g (01/27/18 0753)  . feeding supplement (VITAL AF 1.2 CAL) 50 mL/hr at 01/26/18 1600  . fentaNYL infusion INTRAVENOUS 100 mcg/hr (01/27/18 0800)  . magnesium sulfate 1 - 4 g bolus IVPB    . midazolam (VERSED) infusion 10 mg/hr (01/27/18 0800)  . norepinephrine (LEVOPHED) Adult infusion 8 mcg/min (01/27/18 0800)  .  sodium bicarbonate  infusion 1000 mL 75 mL/hr at 01/27/18 0800  . valproate sodium Stopped (01/26/18 2113)   PRN Meds: Place/Maintain arterial line **AND** sodium chloride, fentaNYL, midazolam, pneumococcal 23 valent vaccine, sodium chloride flush   Vital Signs    Vitals:   01/27/18 0500 01/27/18 0600 01/27/18 0700 01/27/18 0800  BP: 121/70 135/75 117/66 123/69  Pulse: 84 84 78 80  Resp: 16 19 14 14   Temp: 98.8 F (37.1 C) 98.4 F (36.9 C) 98.2 F (36.8 C) 98.6 F (37 C)  TempSrc:    Esophageal  SpO2: 100% 100% 100% 100%  Weight: 95.6 kg     Height:        Intake/Output Summary (Last 24 hours) at 01/27/2018 0903 Last data filed at 01/27/2018 0800 Gross per 24 hour  Intake 3989.4 ml  Output 440 ml  Net 3549.4 ml   Filed Weights   01/25/18 0300 01/26/18 0400  01/27/18 0500  Weight: 83.9 kg 87.7 kg 95.6 kg    Telemetry    NSR, several short runs of NSVT- Personally Reviewed  ECG    No AM EKG - Personally Reviewed  Physical Exam   General: Intubated,  HEENT: OP clear, mucus membranes moist  SKIN: warm, dry. No rashes. Neuro: unable to assess Musculoskeletal: unable to assess Neck: No JVD Lungs: Mechanical BS bilaterally.  Cardiovascular: Regular rate and rhythm. No loud murmurs Abdomen:Soft. Bowel sounds present.  Extremities: Trace to 1+ bilateral lower extremity edema.   Labs    Chemistry Recent Labs  Lab 02/05/2018 1457  01/26/18 0739 01/26/18 1157 01/27/18 0431  NA 140   < > 137 138 139  K 3.8   < > 5.2* 5.4* 4.3  CL 111   < > 108 108 107  CO2 15*   < > 15* 15* 19*  GLUCOSE 110*   < > 94 88 114*  BUN 35*   < > 48* 49* 51*  CREATININE 1.69*   < > 2.77* 2.93* 3.03*  CALCIUM 6.7*   < > 7.5* 7.2* 6.7*  PROT 5.5*  --   --   --  5.1*  ALBUMIN 2.0*  --   --   --  1.7*  AST 26  --   --   --  180*  ALT 17  --   --   --  92*  ALKPHOS 135*  --   --   --  130*  BILITOT 1.1  --   --   --  1.0  GFRNONAA 39*   < > 21* 20* 19*  GFRAA 45*   < > 25* 23* 22*  ANIONGAP 14   < > 14 15 13    < > = values in this interval not displayed.     Hematology Recent Labs  Lab 01/27/2018 1457  01/25/18 0310 01/25/18 0503 01/27/18 0431  WBC 7.0  --   --  3.0* 5.5  RBC 4.32  --   --  4.28 4.04*  HGB 11.1*   < > 11.9* 11.1* 10.4*  HCT 36.6*   < > 35.0* 34.5* 32.2*  MCV 84.7  --   --  80.6 79.7  MCH 25.7*  --   --  25.9* 25.7*  MCHC 30.3  --   --  32.2 32.3  RDW 16.6*  --   --  16.2* 16.5*  PLT 131*  --   --  144* 136*   < > = values in this interval not displayed.    Cardiac Enzymes Recent Labs  Lab 01/16/2018 1705 01/27/2018 2256 01/25/18 0503 01/25/18 1053  TROPONINI 0.29* 1.90* 1.92* 1.29*    Recent Labs  Lab 01/23/2018 1446  TROPIPOC 0.01     BNPNo results for input(s): BNP, PROBNP in the last 168 hours.   DDimer No  results for input(s): DDIMER in the last 168 hours.   Radiology    Ct Head Wo Contrast  Result Date: 01/26/2018 CLINICAL DATA:  Anoxic brain injury EXAM: CT HEAD WITHOUT CONTRAST TECHNIQUE: Contiguous axial images were obtained from the base of the skull through the vertex without intravenous contrast. COMPARISON:  Head CT 01/21/2018 FINDINGS: Brain: There is no mass, hemorrhage or extra-axial collection. There is generalized atrophy without lobar predilection. There is no acute or chronic infarction. There is hypoattenuation of the periventricular white matter, most commonly indicating chronic ischemic microangiopathy. Vascular: No abnormal hyperdensity of the major intracranial arteries or dural venous sinuses. No intracranial atherosclerosis. Skull: The visualized skull base, calvarium and extracranial soft tissues are normal. Sinuses/Orbits: No fluid levels or advanced mucosal thickening of the visualized paranasal sinuses. No mastoid or middle ear effusion. The orbits are normal. IMPRESSION: No acute findings. Unchanged appearance of mild generalized atrophy and chronic ischemic microangiopathy. Electronically Signed   By: Ulyses Jarred M.D.   On: 01/26/2018 14:23   US Renal  Result Date: 01/26/2018 CLINICAL DATA:  Acute renal failure, chronic kidney disease EXAM: RENAL / URINARY TRACT ULTRASOUND COMPLETE COMPARISON:  None. FINDINGS: Right Kidney: Length: 10.8 cm. Increased cortical echogenicity. No hydronephrosis. Left Kidney: Length: Increased cortical echogenicity. No hydronephrosis. Upper pole hypoechoic lesion measuring 2.3 x 1.7 x 2.1 cm, difficult evaluation due to posterior location. Echogenicity within normal limits. No mass or hydronephrosis visualized. Bladder: Foley catheter in the bladder. IMPRESSION: 1. Increased cortical echogenicity suggesting medical renal disease, no hydronephrosis. 2. Hypoechoic lesion upper pole left kidney measuring 2.3 cm, possible complicated cyst but poorly  visualized. When clinically feasible, could further evaluate with dedicated renal CT or MRI. Electronically Signed   By: Donavan Foil M.D.   On: 01/26/2018 20:06   Dg Chest Port 1 View  Result Date: 01/26/2018 CLINICAL DATA:  Acute respiratory failure, cardiac arrest, former smoker. EXAM: PORTABLE CHEST 1 VIEW COMPARISON:  Portable chest x-ray of January 24, 2018 FINDINGS: The lungs are adequately inflated. There is a persistent small right pleural effusion. There is right basilar atelectasis or infiltrate. The cardiac silhouette remains enlarged. The pulmonary vascularity is less engorged and more distinct. The endotracheal tube tip lies 4.1 cm above the carina. The esophagogastric tube tip projects below the inferior margin of the image. The left internal jugular venous catheter tip projects at the over the proximal SVC. IMPRESSION: Fairly stable appearance of the chest. Stable cardiomegaly. Decreased pulmonary vascular prominence. Persistent right basilar atelectasis or pneumonia with small right pleural effusion. The support tubes are in reasonable position. Electronically Signed   By: David  Martinique M.D.   On: 01/26/2018 09:29    Cardiac Studies   Echo 01/25/18: - Left ventricle: The cavity size was moderately dilated. Systolic   function was severely reduced. The estimated ejection fraction   was in the range of 20% to 25%. Diffuse hypokinesis. The study is   not technically sufficient to allow evaluation of LV diastolic   function. - Aortic valve: Trileaflet; mildly thickened leaflets. There was   mild regurgitation. - Mitral valve: Calcified annulus. Mildly thickened leaflets .   There was moderate regurgitation. - Left atrium: The atrium was moderately to severely dilated.   Volume/bsa, ES, (1-plane Simpson&'s, A2C): 49.6 ml/m^2. - Right ventricle: Systolic function was moderately reduced. - Right atrium: The atrium was moderately dilated. - Tricuspid valve: There was moderate  regurgitation. - Pulmonary arteries: Systolic pressure was moderately increased.   PA peak pressure: 54 mm Hg (S).  Patient Profile     72 y.o. male with known NICM with LVEF=10% by echo 01/21/18, CKD, prior PE on  Coumadin, HTN, HLD admitted following out of hospital VF arrest. Pt is followed by cardiology in [place], [place] and has refused ICD in the past.   Assessment & Plan    1. NICM/Ventricular fibrillation arrest: He is followed for a dilated NICM in [place]. He has refused ICD in the past. Now admitted with a VF arrest. Flat troponin trend with mild elevation, not suggestive of ACS. He has completed the hypothermia protocol. Echo 01/25/18 with LVEF=20-25%. This is unchanged from prior echo.  -no events overnight. Will continue to follow along and will make further plans for cardiac evaluation if he has full neurological recovery. He would need an ICD before discharge.     2. NSVT: Several short runs of NSVT overnight (3-4 beats). Amiodarone has been stopped due to prolonged QT. Will check EKG today.   For questions or updates, please contact [place] Please consult [email or web address] for contact info under        Signed, Lauree Chandler, MD  01/27/2018, 9:03 AM

## 2018-01-27 NOTE — Progress Notes (Signed)
Long discussion with the patient's spouse, and 3 daughters in the conference room.  We discussed the patient's belief system, scheduled medical thoughts, current diagnoses, pending tests, and potential treatments.  At the end of our discussion we determined the following in regards to goals of care 1.  Should he suffer another cardiac arrest we will not resuscitate, we will make him full DO NOT RESUSCITATE  2.   Given the poor prognostic's for neurological recovery should his renal function continued to deteriorate we will not offer dialysis 3.   We will continue current level of care, including vasoactive drips, antibiotics, treatment aimed at stopping seizure activity, and then subsequently weaning sedation.  Plan is to continue support for the next 4 days (through the weekend)  giving [place] enough time to hopefully control seizure, then wean sedation and assess degree of neurological recovery  If he remains comatose we will re-meet again and at that point discuss withdrawal of care.  The family unanimously agrees that he would not want tracheostomy, feeding tube, or long-term disposition in skilled nursing facility   Erick Colace ACNP-BC [facility] Pager # [phone] OR # [phone] if no answer

## 2018-01-27 NOTE — Progress Notes (Signed)
NAME:  Andrew Lewis, MRN:  [id number], DOB:  Jan 18, 1946, LOS: 3 ADMISSION DATE:  01/16/2018, CONSULTATION DATE:  01/30/2018 REFERRING MD:  A. Zenia Resides (ED), CHIEF COMPLAINT:  S/P V-fib arrest   Brief History   Andrew Lewis is a 72 y.o. B M brought to [facility] ED following a V-fib arrest. Per the patient's wife, the were en route back to their home in [place], [place] from [place] and stopped at [employer] in [place]. After eating and getting back into their car, the patient suddenly became unresponsive without a prior complaint of chest pain, SOB or noticeable lateralizing signs or symptoms. EMS was called, arrived within 15 min and found the patient apneic and pulseless in V-fib arrest. He received shock x 3, epi x 2 and arrived on Epi drip; Amiodarone 300mg  was also given prior to arrival. He reportedly had intermittent pulses during the 30 min transport to [facility] with approximate down time to initial ROSC of 7 min. ECG post arrival without evidence of STEMI and TnI was 0.03.  The wife states that the patient has a hx HTN, DM and an arrhythmia in the past, for which he was on anticoagulants. He does see a cardiologist in [place]. He also has a hx OSA and uses CPAP Significant Hospital Events   9/16 patient undergoing TTM protocol at [facility] Started last night. Left IJ catheter inserted for levophend to maintain MAP > 31mmHg.  9/17: Temperature management protocol discontinued at 7 AM with neuromuscular blockade stopped.  Lip twitching and eye movement worrisome for possible status.  Stat EEG and neurology consult requested.  Remains in shock with escalating pressor requirements, worsening acidosis, and worsening endorgan function.  Pancultured.  Empiric Unasyn started.  Bicarbonate infusion started for non-gap component of acidosis.  EEG showed: GPEDs started on Depakote, seizure activity did not resolve, placed on continuous EEG monitoring Versed titrated up 9/18: EEG still showing semi-periodic epileptiform  discharges in bilateral frontotemporal region, but no seizure.  Versed infusion continued.  Still in shock on vasoactive drips.  Renal function worse  Consults: date of consult/date signed off & final recs:  Cardiology 9/15: will consider cardiac cath when and if meaningful recovery is achieved. Neurology consulted on 9/17 for possible seizure  Procedures (surgical and bedside):  Intubation: 9/15 CVC: 9/15  Significant Diagnostic Tests:  CT Head: 9/15: neg EEG 9/16: burst suppression. No seizure  Echocardiogram 9/16:Ejection fraction 20 to 25% cavity mildly dilated systolic function severely reduced EEG 9/17: GPEDs Renal ultrasound 9/17>>> medical renal disease no hydro-nephrosis.  There is a 2.3 cm lesion on left kidney upper pole could represent cyst FENa: 0.33 EEG 9/18: Severe slowing.  Semi-periodic epileptiform discharges from the bilateral frontotemporal region seizures Micro Data: Blood culture times two 9/17 Urine culture 9/17 Sputum culture 9/17: Abundant white blood cells, moderate gram-positive rods, few gram-positive cocci>>>   Antimicrobials:  Unasyn 9/17>>>  Subjective:  Sedated Objective   Blood pressure 130/71, pulse 81, temperature 98.6 F (37 C), temperature source Esophageal, resp. rate (Abnormal) 22, height 5\' 9"  (1.753 m), weight 95.6 kg, SpO2 100 %. CVP:  [8 mmHg-16 mmHg] 8 mmHg  Vent Mode: PRVC FiO2 (%):  [30 %-40 %] 30 % Set Rate:  [16 bmp] 16 bmp Vt Set:  [570 mL] 570 mL PEEP:  [5 cmH20] 5 cmH20 Plateau Pressure:  [18 cmH20-28 cmH20] 28 cmH20   Intake/Output Summary (Last 24 hours) at 01/27/2018 1022 Last data filed at 01/27/2018 0800 Gross per 24 hour  Intake 3806.67 ml  Output 440 ml  Net 3366.67 ml   Filed Weights   01/25/18 0300 01/26/18 0400 01/27/18 0500  Weight: 83.9 kg 87.7 kg 95.6 kg    Examination: General: 72 year old male patient currently on full ventilator support unresponsive on Versed infusion HEENT normocephalic atraumatic  orally intubated Cardiac: Regular rate and rhythm Abdomen: Soft nontender Pulmonary: Equal chest rise on mechanically assisted ventilation.  No rhonchi or rales Neuro: Heavily sedated no further seizure activity Extremities: Warm dry dependent edema strong pulses GU: Clear yellow  Resolved/inactive Hospital Problem list    Assessment & Plan:   S/P V-fib arrest in setting of known nonischemic cardiomyopathy.  He has an ejection fraction 10%  Remains in cardiogenic shock Remains in shock however pressor requirements improved Plan Change goal mean arterial pressure to 65 or greater continue to titrate norepinephrine  CVP greater than 8  Continuing bicarbonate infusion  Follow-up pending cultures  Amiodarone stopped due to prolonged QTC If he were to survive would need ischemia evaluation and ICD however I do not think this is likely  Acute hypoxic respiratory failure status post cardiac arrest, with right-sided pulmonary infiltrate and pleural effusion Chest x-ray personally reviewed: Small right effusion versus atelectasis versus infiltrate.  Aeration looks about the same when comparing prior films -Weaning on hold due to seizure activity Plan Continuing full ventilator support Decrease minute ventilation as we continue to correct metabolic acidosis VAP bundle Day #2 Unasyn Follow-up culture data Assess daily for readiness to wean although given mental status I am doubtful that he will be successfully extubated without tracheostomy  Acute metabolic encephalopathy, presumed hypoxic encephalopathy. Seizure activity/GPEDs -Had 7-minute time to ROSC but unknown downtime prior to that. -EEG initially demonstrating burst suppression consistent with severe neurological injury, CT head was normal -He is status post temperature management protocol, neuromuscular blockade was discontinued at 7 AM on 9/17 -Now on continuous EEG still having epileptiform activity but no current  seizure Plan Continuing valproic acid Tapering Versed Supportive care, avoiding fever Will need to have goals of care discussion with family  Acute on chronic renal failure -Unknown baseline -Oliguric over the last 24 hours -Serum creatinine continues to rise -FENa still suggest prerenal state as of yesterday on 9/17 -Plan Continuing hemodynamic support Avoid hypotension Strict intake and output Renal dose medications   Mixed anion gap and non-anion gap metabolic acidosis.  I suspect this is a mix of both renal failure and organ hypoperfusion -acid base improving.  Plan Decreasing Ve given correction of metabolic status Decrease bicarb rate  Cont serial ABGs and chemistries   Intermittent fluid and electrolyte balance Plan Trend chemistry  Obstructive sleep apnea Plan If survives will likely need trach  Type II DM. Plan Sliding scale insulin   Right lower extremity externally rotated.  ? MS injury Plan No imaging for now given probable neurological injury   Mild anemia without evidence of bleeding Plan Trend cbc  Mild thrombocytopenia Plan Trend CBC  Disposition / Summary of Today's Plan 01/27/18   Critically ill.  Remains in shock.  Continues to have multiorgan failure.  Renal function worse.  Still having epileptiform discharges but no active seizure.  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  We will need to have family conference to determine further goals of care it is unlikely he will have functional recovery   Diet: npo-->start tubefeeds 9/17 Pain/Anxiety/Delirium protocol (if indicated): Versed infusion per allergy VAP protocol (if indicated): Ordered DVT prophylaxis: sub q heparin  GI prophylaxis: H2 blocker Hyperglycemia protocol: s/s insulin Mobility:bedbound Code Status: full code Family Communication: discussed care plan at the bedside with the wife.  Labs   CBC: Recent Labs  Lab 02/02/2018 1457  01/25/18 0013  01/25/18 0208 01/25/18 0310 01/25/18 0503 01/27/18 0431  WBC 7.0  --   --   --   --  3.0* 5.5  NEUTROABS 1.1*  --   --   --   --   --  3.1  HGB 11.1*   < > 11.2* 11.9* 11.9* 11.1* 10.4*  HCT 36.6*   < > 33.0* 35.0* 35.0* 34.5* 32.2*  MCV 84.7  --   --   --   --  80.6 79.7  PLT 131*  --   --   --   --  144* 136*   < > = values in this interval not displayed.   Basic Metabolic Panel: Recent Labs  Lab 02/05/2018 1705  01/25/18 0503  01/25/18 2347 01/26/18 0353 01/26/18 0739 01/26/18 0915 01/26/18 1157 01/26/18 1540 01/27/18 0431  NA  --    < > 136   < > 138 136 137  --  138  --  139  K  --    < > 4.4   < > 4.7 4.9 5.2*  --  5.4*  --  4.3  CL  --    < > 107   < > 112* 106 108  --  108  --  107  CO2  --   --  16*   < > 15* 14* 15*  --  15*  --  19*  GLUCOSE  --    < > 140*   < > 76 86 94  --  88  --  114*  BUN  --    < > 44*   < > 47* 45* 48*  --  49*  --  51*  CREATININE  --    < > 2.36*   < > 2.57* 2.63* 2.77*  --  2.93*  --  3.03*  CALCIUM  --   --  7.6*   < > 7.2* 7.4* 7.5*  --  7.2*  --  6.7*  MG 1.7  --  2.0  --   --   --   --  1.9  --  1.8 1.7  PHOS  --   --  2.4*  --   --   --   --  4.3  --  4.3 4.1   < > = values in this interval not displayed.   GFR: Estimated Creatinine Clearance: 25.2 mL/min (A) (by C-G formula based on SCr of 3.03 mg/dL (H)). Recent Labs  Lab 01/13/2018 1455 02/03/2018 1457 01/20/2018 1711 01/25/18 0503 01/26/18 0932 01/27/18 0431  WBC  --  7.0  --  3.0*  --  5.5  LATICACIDVEN 4.37*  --  3.99*  --  3.4*  --    Liver Function Tests: Recent Labs  Lab 01/21/2018 1457 01/27/18 0431  AST 26 180*  ALT 17 92*  ALKPHOS 135* 130*  BILITOT 1.1 1.0  PROT 5.5* 5.1*  ALBUMIN 2.0* 1.7*   No results for input(s): LIPASE, AMYLASE in the last 168 hours. No results for input(s): AMMONIA in the last 168 hours. ABG    Component Value Date/Time   PHART 7.398 01/27/2018 0006   PCO2ART 29.2 (L) 01/27/2018 0006   PO2ART 126.0 (H) 01/27/2018 0006   HCO3  18.0 (L) 01/27/2018 0006  TCO2 19 (L) 01/27/2018 0006   ACIDBASEDEF 6.0 (H) 01/27/2018 0006   O2SAT 99.0 01/27/2018 0006    Coagulation Profile: Recent Labs  Lab 01/22/2018 1505 01/30/2018 2259  INR 1.45 1.54   Cardiac Enzymes: Recent Labs  Lab 01/18/2018 1457 01/15/2018 1705 01/10/2018 2256 01/25/18 0503 01/25/18 1053  TROPONINI 0.03* 0.29* 1.90* 1.92* 1.29*   HbA1C: No results found for: HGBA1C CBG: Recent Labs  Lab 01/26/18 1543 01/26/18 1945 01/26/18 2339 01/27/18 0015 01/27/18 0345  GLUCAP 86 77 68* 98 [place] ACNP-BC [place] Pager # [phone] OR # [phone] if no answer

## 2018-01-28 ENCOUNTER — Inpatient Hospital Stay (HOSPITAL_COMMUNITY): Payer: Medicare HMO

## 2018-01-28 LAB — POCT I-STAT 3, ART BLOOD GAS (G3+)
ACID-BASE EXCESS: 5 mmol/L — AB (ref 0.0–2.0)
Bicarbonate: 29.4 mmol/L — ABNORMAL HIGH (ref 20.0–28.0)
O2 SAT: 99 %
PO2 ART: 119 mmHg — AB (ref 83.0–108.0)
Patient temperature: 36.7
TCO2: 31 mmol/L (ref 22–32)
pCO2 arterial: 43.2 mmHg (ref 32.0–48.0)
pH, Arterial: 7.439 (ref 7.350–7.450)

## 2018-01-28 LAB — GLUCOSE, CAPILLARY
GLUCOSE-CAPILLARY: 166 mg/dL — AB (ref 70–99)
Glucose-Capillary: 161 mg/dL — ABNORMAL HIGH (ref 70–99)
Glucose-Capillary: 129 mg/dL — ABNORMAL HIGH (ref 70–99)
Glucose-Capillary: 140 mg/dL — ABNORMAL HIGH (ref 70–99)
Glucose-Capillary: 111 mg/dL — ABNORMAL HIGH (ref 70–99)
Glucose-Capillary: 146 mg/dL — ABNORMAL HIGH (ref 70–99)

## 2018-01-28 LAB — COMPREHENSIVE METABOLIC PANEL
ALBUMIN: 1.6 g/dL — AB (ref 3.5–5.0)
ALK PHOS: 138 U/L — AB (ref 38–126)
ALT: 101 U/L — AB (ref 0–44)
ANION GAP: 12 (ref 5–15)
AST: 140 U/L — ABNORMAL HIGH (ref 15–41)
BUN: 62 mg/dL — ABNORMAL HIGH (ref 8–23)
CALCIUM: 6.8 mg/dL — AB (ref 8.9–10.3)
CO2: 24 mmol/L (ref 22–32)
CREATININE: 3.41 mg/dL — AB (ref 0.61–1.24)
Chloride: 102 mmol/L (ref 98–111)
GFR calc Af Amer: 19 mL/min — ABNORMAL LOW (ref >60)
GFR calc non Af Amer: 17 mL/min — ABNORMAL LOW (ref >60)
GLUCOSE: 158 mg/dL — AB (ref 70–99)
Potassium: 5 mmol/L (ref 3.5–5.1)
Sodium: 138 mmol/L (ref 135–145)
TOTAL PROTEIN: 5.3 g/dL — AB (ref 6.5–8.1)
Total Bilirubin: 1.3 mg/dL — ABNORMAL HIGH (ref 0.3–1.2)

## 2018-01-28 LAB — CULTURE, RESPIRATORY: CULTURE: NORMAL

## 2018-01-28 LAB — CULTURE, RESPIRATORY W GRAM STAIN

## 2018-01-28 MED ORDER — FREE WATER
200.0000 mL | Freq: Four times a day (QID) | Status: DC
  Administered 2018-01-28 – 2018-02-01 (×15): 200 mL

## 2018-01-28 NOTE — Progress Notes (Addendum)
Reason for consult: Anoxic injury  Subjective: Patient intubated, on minimal sedation   ROS:  Unable to obtain due to poor mental status  Examination  Vital signs in last 24 hours: Temp:  [98.4 F (36.9 C)-98.8 F (37.1 C)] 98.8 F (37.1 C) (09/19 1606) Pulse Rate:  [74-87] 83 (09/19 1500) Resp:  [11-22] 18 (09/19 1500) BP: (95-140)/(46-73) 140/73 (09/19 1500) SpO2:  [100 %] 100 % (09/19 1500) Arterial Line BP: (98-150)/(45-65) 135/56 (09/19 1500) FiO2 (%):  [30 %] 30 % (09/19 1500) Weight:  [92.6 kg] 92.6 kg (09/19 0500)  General:  lying in bed CVS: pulse-normal rate and rhythm RS: breathing comfortably Extremities: normal   Neuro: Mental Status: Patient does not respond to verbal stimuli.  Does not respond to deep sternal rub.  Does not follow commands.  No verbalizations noted.  Cranial Nerves: II: Pupils are equal bilateral, not responding III,IV,VI: doll's response absent bilaterally.  V,VII: corneal reflex: Present VIII: patient does not respond to verbal stimuli IX,X: Cough present XI: trapezius strength unable to test bilaterally XII: tongue strength unable to test Motor: Extremities flaccid throughout.  No spontaneous movement noted.  No purposeful movements noted.  Sensory: Does not respond to noxious stimuli in any extremity. Plantars: mute Cerebellar: Unable to perform  Basic Metabolic Panel: Recent Labs  Lab 01/25/18 0503  01/26/18 0915 01/26/18 1157 01/26/18 1540 01/27/18 0431 01/27/18 1235 01/27/18 1629 01/28/18 0506  NA 136   < >  --  138  --  139 140 139 138  K 4.4   < >  --  5.4*  --  4.3 4.0 3.9 5.0  CL 107   < >  --  108  --  107 105 104 102  CO2 16*   < >  --  15*  --  19* 24 22 24   GLUCOSE 140*   < >  --  88  --  114* 140* 160* 158*  BUN 44*   < >  --  49*  --  51* 55* 56* 62*  CREATININE 2.36*   < >  --  2.93*  --  3.03* 3.27* 3.33* 3.41*  CALCIUM 7.6*   < >  --  7.2*  --  6.7* 6.9* 6.9* 6.8*  MG 2.0  --  1.9  --  1.8 1.7  --   2.1  --   PHOS 2.4*  --  4.3  --  4.3 4.1  --  3.9  --    < > = values in this interval not displayed.    CBC: Recent Labs  Lab 01/18/2018 1457  01/25/18 0013 01/25/18 0208 01/25/18 0310 01/25/18 0503 01/27/18 0431  WBC 7.0  --   --   --   --  3.0* 5.5  NEUTROABS 1.1*  --   --   --   --   --  3.1  HGB 11.1*   < > 11.2* 11.9* 11.9* 11.1* 10.4*  HCT 36.6*   < > 33.0* 35.0* 35.0* 34.5* 32.2*  MCV 84.7  --   --   --   --  80.6 79.7  PLT 131*  --   --   --   --  144* 136*   < > = values in this interval not displayed.     Coagulation Studies: No results for input(s): LABPROT, INR in the last 72 hours.  Imaging Reviewed:     ASSESSMENT AND PLAN  72 y.o.malewith history of pulmonary embolism, prolonged QT interval,  nonsustained ventricular tachycardia, hypertension, hyperlipidemia, heart failure with a reduced ejection fractionwith Vfib arrest s/p hypothermia protocol. Repeat CT head: does not show diffuse cerebral edema  Anoxic Brain Injury s/p cardiac arrest, received hypothermia protocol  Stat EEG shows Generalized periodic epileptiform discharges with poor background.  LTM EEG showed reduced amplitude of GPDs, no seizures. Sedation weaned. Too Early to prognosticate   Plan D/c LTM EEG Continue Depakote Will reexamine on Sunday, if exam poor then plan MRI Brain and goals of care discussion.    Karena Addison Kyrsten Deleeuw [facility] Pager Number [phone] For questions after 7pm please refer to AMION to reach the Neurologist on call

## 2018-01-28 NOTE — Progress Notes (Signed)
Poor neurological prognosis. Cardiology will continue to follow. I will check in on him tomorrow. Please call with questions.   Lauree Chandler 01/28/2018 7:22 AM

## 2018-01-28 NOTE — Progress Notes (Signed)
NAME:  Andrew Lewis, MRN:  [id number], DOB:  1946/01/25, LOS: 4 ADMISSION DATE:  01/21/2018, CONSULTATION DATE:  02/08/2018 REFERRING MD:  A. Zenia Resides (ED), CHIEF COMPLAINT:  S/P V-fib arrest   Brief History   Andrew Lewis is a 72 y.o. B M brought to [facility] ED following a V-fib arrest. Per the patient's wife, the were en route back to their home in [place], [place] from [place] and stopped at [employer] in [place]. After eating and getting back into their car, the patient suddenly became unresponsive without a prior complaint of chest pain, SOB or noticeable lateralizing signs or symptoms. EMS was called, arrived within 15 min and found the patient apneic and pulseless in V-fib arrest. He received shock x 3, epi x 2 and arrived on Epi drip; Amiodarone 300mg  was also given prior to arrival. He reportedly had intermittent pulses during the 30 min transport to [facility] with approximate down time to initial ROSC of 7 min. ECG post arrival without evidence of STEMI and TnI was 0.03.  The wife states that the patient has a hx HTN, DM and an arrhythmia in the past, for which he was on anticoagulants. He does see a cardiologist in [place]. He also has a hx OSA and uses CPAP Significant Hospital Events   9/16 patient undergoing TTM protocol at [facility]. Started last night. Left IJ catheter inserted for levophend to maintain MAP > 62mmHg.  9/17: Temperature management protocol discontinued at 7 AM with neuromuscular blockade stopped.  Lip twitching and eye movement worrisome for possible status.  Stat EEG and neurology consult requested.  Remains in shock with escalating pressor requirements, worsening acidosis, and worsening endorgan function.  Pancultured.  Empiric Unasyn started.  Bicarbonate infusion started for non-gap component of acidosis.  EEG showed: GPEDs started on Depakote, seizure activity did not resolve, placed on continuous EEG monitoring Versed titrated up 9/18: EEG still showing semi-periodic epileptiform  discharges in bilateral frontotemporal region, but no seizure.  Versed infusion continued.  Still in shock on vasoactive drips.  Renal function worse.  With wife, and 3 daughters.  Goals of care discussed.  May DO NOT RESUSCITATE.  Decided against dialysis.  Goal is to continue supportive care with and watch for neurological recovery over the next several days and if no improvement likely transition to palliative approach 9/19: Remains unresponsive off from all sedating drips  Consults: date of consult/date signed off & final recs:  Cardiology 9/15: will consider cardiac cath when and if meaningful recovery is achieved. Neurology consulted on 9/17 for possible seizure  Procedures (surgical and bedside):  Intubation: 9/15 CVC: 9/15  Significant Diagnostic Tests:  CT Head: 9/15: neg EEG 9/16: burst suppression. No seizure  Echocardiogram 9/16:Ejection fraction 20 to 25% cavity mildly dilated systolic function severely reduced EEG 9/17: GPEDs Renal ultrasound 9/17>>> medical renal disease no hydro-nephrosis.  There is a 2.3 cm lesion on left kidney upper pole could represent cyst FENa: 0.33 EEG 9/18: Severe slowing.  Semi-periodic epileptiform discharges from the bilateral frontotemporal region seizures Micro Data: Blood culture times two 9/17 Urine culture 9/17 Sputum culture 9/17: Abundant white blood cells, moderate gram-positive rods, few gram-positive cocci>>>   Antimicrobials:  Unasyn 9/17>>>  Subjective:  Sedated Objective   Blood pressure 114/62, pulse 78, temperature 98.4 F (36.9 C), resp. rate 12, height 5\' 9"  (1.753 m), weight 92.6 kg, SpO2 100 %. CVP:  [7 mmHg-21 mmHg] 8 mmHg  Vent Mode: PRVC FiO2 (%):  [30 %] 30 % Set Rate:  [  12 bmp] 12 bmp Vt Set:  [570 mL] 570 mL PEEP:  [5 cmH20] 5 cmH20 Plateau Pressure:  [15 cmH20-25 cmH20] 15 cmH20   Intake/Output Summary (Last 24 hours) at 01/28/2018 0943 Last data filed at 01/28/2018 0529 Gross per 24 hour  Intake 2886.23  ml  Output 745 ml  Net 2141.23 ml   Filed Weights   01/26/18 0400 01/27/18 0500 01/28/18 0500  Weight: 87.7 kg 95.6 kg 92.6 kg    Examination:  General: 72 year old African-American male who is currently unresponsive on the ventilator.  Versed's been off since yesterday, where turning off fentanyl infusion currently. HEENT normocephalic atraumatic orally intubated no JVD mucous membranes are moist Pulmonary: Equal chest rise on mechanically assisted breath, no rales or rhonchi appreciated Cardiac: Regular rate and rhythm Abdomen: Soft nontender Extremities: Warm, dry, dependent edema appreciated, strong pulses. Neuro: Unresponsive GU: Clear yellow, urine output appears to be increasing  Resolved/inactive Hospital Problem list   Obstructive sleep apnea  Assessment & Plan:   S/P V-fib arrest in setting of known nonischemic cardiomyopathy.  He has an ejection fraction 10%  Remains in cardiogenic shock Remains in shock however pressor requirements improved Plan Wean norepinephrine for MAP >65 CVP > 8 Dc bicarb Amiodarone stopped.  Unlikely to proceed w/ ischemia eval or ICD  Acute hypoxic respiratory failure status post cardiac arrest, with right-sided pulmonary infiltrate and pleural effusion Endotracheal tubes in satisfactory position.  Right sided airspace disease with probable element of effusion this looks a little worse when comparing film from 18th Plan Continuing full ventilator support  VAP bundle  Day #3 Unasyn  Follow-up cultures Will decrease frequency of ABG monitoring Change chest x-ray to as needed   Acute metabolic encephalopathy, presumed hypoxic encephalopathy. Seizure activity/GPEDs -Had 7-minute time to ROSC but unknown downtime prior to that. -EEG initially demonstrating burst suppression consistent with severe neurological injury, CT head was normal -He is status post temperature management protocol, neuromuscular blockade was discontinued at 7 AM  on 9/17 -No further seizures as of 9/19 Plan PAD protocol RASS goal 0  Continue valproic acid  Avoid fever  Continue neurological checks, will decide on MRI imaging.  Acute on chronic renal failure -Unknown baseline -Oliguric over the last 24 hours -Serum creatinine continues to rise but urine output picking up -Patient would not want dialysis -Plan Avoid hypotension Renal dose medications No role for nephrology Trend chemistry   Mixed anion gap and non-anion gap metabolic acidosis.  I suspect this is a mix of both renal failure and organ hypoperfusion -acid base improving, acidosis resolved Plan KVO IV fluids Discontinue bicarbonate  Intermittent fluid and electrolyte balance Plan Trend chemistry, replace as indicated  Type II DM. Plan Sliding scale insulin  Right lower extremity externally rotated.  ? MS injury Plan No planned imaging given clinically appears to have significant neurological injury  Mild anemia without evidence of bleeding Plan Trend CBC  Mild thrombocytopenia Plan Trend CBC  Disposition / Summary of Today's Plan 01/28/18   Critically ill.  Remains in shock.  Continues to have multiorgan failure.  Renal function worse.  Still having epileptiform discharges but no active seizure.  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  We will need to have family conference to determine further goals of care it is unlikely he will have functional recovery   Diet: npo-->start tubefeeds 9/17 Pain/Anxiety/Delirium protocol (if indicated): PAD goal 0 effective 9/19 VAP protocol (if indicated): Ordered DVT prophylaxis: sub q heparin  GI prophylaxis: H2 blocker Hyperglycemia protocol: s/s insulin Mobility:bedbound Code Status: He is now full DO NOT RESUSCITATE, we will not escalate care, he is not a dialysis candidate as confirmed by his wife and 3 daughters by goals of care discussion carried out on 9/18 Family Communication:  discussed care plan at the bedside with the wife.  Labs   CBC: Recent Labs  Lab 01/18/2018 1457  01/25/18 0013 01/25/18 0208 01/25/18 0310 01/25/18 0503 01/27/18 0431  WBC 7.0  --   --   --   --  3.0* 5.5  NEUTROABS 1.1*  --   --   --   --   --  3.1  HGB 11.1*   < > 11.2* 11.9* 11.9* 11.1* 10.4*  HCT 36.6*   < > 33.0* 35.0* 35.0* 34.5* 32.2*  MCV 84.7  --   --   --   --  80.6 79.7  PLT 131*  --   --   --   --  144* 136*   < > = values in this interval not displayed.   Basic Metabolic Panel: Recent Labs  Lab 01/25/18 0503  01/26/18 0915 01/26/18 1157 01/26/18 1540 01/27/18 0431 01/27/18 1235 01/27/18 1629 01/28/18 0506  NA 136   < >  --  138  --  139 140 139 138  K 4.4   < >  --  5.4*  --  4.3 4.0 3.9 5.0  CL 107   < >  --  108  --  107 105 104 102  CO2 16*   < >  --  15*  --  19* 24 22 24   GLUCOSE 140*   < >  --  88  --  114* 140* 160* 158*  BUN 44*   < >  --  49*  --  51* 55* 56* 62*  CREATININE 2.36*   < >  --  2.93*  --  3.03* 3.27* 3.33* 3.41*  CALCIUM 7.6*   < >  --  7.2*  --  6.7* 6.9* 6.9* 6.8*  MG 2.0  --  1.9  --  1.8 1.7  --  2.1  --   PHOS 2.4*  --  4.3  --  4.3 4.1  --  3.9  --    < > = values in this interval not displayed.   GFR: Estimated Creatinine Clearance: 22 mL/min (A) (by C-G formula based on SCr of 3.41 mg/dL (H)). Recent Labs  Lab 01/16/2018 1455 01/14/2018 1457 01/18/2018 1711 01/25/18 0503 01/26/18 0932 01/27/18 0431 01/27/18 1235  WBC  --  7.0  --  3.0*  --  5.5  --   LATICACIDVEN 4.37*  --  3.99*  --  3.4*  --  1.8   Liver Function Tests: Recent Labs  Lab 01/26/2018 1457 01/27/18 0431 01/27/18 1235 01/27/18 1629 01/28/18 0506  AST 26 180* 177* 176* 140*  ALT 17 92* 104* 111* 101*  ALKPHOS 135* 130* 124 131* 138*  BILITOT 1.1 1.0 1.1 1.0 1.3*  PROT 5.5* 5.1* 5.1* 5.3* 5.3*  ALBUMIN 2.0* 1.7* 1.6* 1.6* 1.6*   No results for input(s): LIPASE, AMYLASE in the last 168 hours. No results for input(s): AMMONIA in the last 168  hours. ABG    Component Value Date/Time   PHART 7.439 01/28/2018 0612   PCO2ART 43.2 01/28/2018 0612   PO2ART 119.0 (H) 01/28/2018 0612   HCO3 29.4 (H) 01/28/2018 0612   TCO2 31 01/28/2018 0612   ACIDBASEDEF  1.0 01/27/2018 1641   O2SAT 99.0 01/28/2018 0612    Coagulation Profile: Recent Labs  Lab 01/14/2018 1505 02/05/2018 2259  INR 1.45 1.54   Cardiac Enzymes: Recent Labs  Lab 01/26/2018 1457 01/21/2018 1705 01/27/2018 2256 01/25/18 0503 01/25/18 1053  TROPONINI 0.03* 0.29* 1.90* 1.92* 1.29*   HbA1C: No results found for: HGBA1C CBG: Recent Labs  Lab 01/27/18 1507 01/27/18 2038 01/27/18 2340 01/28/18 0339 01/28/18 0911  GLUCAP 161* 104* 117* 129* 166*   Erick Colace ACNP-BC [place] Pager # [phone] OR # [phone] if no answer

## 2018-01-28 NOTE — Progress Notes (Signed)
IV Fentanyl gtt d/c'd.  50ccs IV wasted in sink w/Abiy B.,RN

## 2018-01-28 NOTE — Consult Note (Signed)
[facility] Nurse wound consult note Patient examined in [place].  No family present.   Reason for Consult: Wound on chest Wound type: skin tear from defibrillator pad removal. Measurement: 11.3 cm x 11 cm x no measurable depth Wound bed: Pink, clean Drainage (amount, consistency, odor) No drainage, no odor, no induration Periwound: Intact, normal color and textuire Dressing procedure/placement/frequency: Place a vaseline gauze to the wound on the left chest, cover with a foam dressing.  Change every 3 days. Monitor the wound area(s) for worsening of condition such as: Signs/symptoms of infection,  Increase in size,  Development of or worsening of odor, Development of pain, or increased pain at the affected locations.  Notify the medical team if any of these develop.  Thank you for the consult.  Discussed plan of care with the patient and bedside nurse.  [place] nurse will not follow at this time.  Please re-consult the [place] team if needed.  Val Riles, RN, MSN, CWOCN, CNS-BC, pager [phone]

## 2018-01-28 NOTE — Progress Notes (Signed)
Noted s/p removal of Defibrillator pad circular skin tear and possible burn from defibrillator pad.  Beckie Salts, RN ([facility]) and WOC notified to assess findings. Sacral Mepelix dressing applied to site until further assessed by WOC.Marland Kitchen

## 2018-01-29 ENCOUNTER — Encounter (HOSPITAL_COMMUNITY): Payer: Self-pay

## 2018-01-29 ENCOUNTER — Inpatient Hospital Stay (HOSPITAL_COMMUNITY): Payer: Medicare HMO

## 2018-01-29 LAB — CBC WITH DIFFERENTIAL/PLATELET
BASOS ABS: 0 10*3/uL (ref 0.0–0.1)
Basophils Relative: 1 %
EOS PCT: 1 %
Eosinophils Absolute: 0 10*3/uL (ref 0.0–0.7)
HCT: 30.2 % — ABNORMAL LOW (ref 39.0–52.0)
Hemoglobin: 9.8 g/dL — ABNORMAL LOW (ref 13.0–17.0)
LYMPHS ABS: 0.7 10*3/uL (ref 0.7–4.0)
Lymphocytes Relative: 17 %
MCH: 25.7 pg — ABNORMAL LOW (ref 26.0–34.0)
MCHC: 32.5 g/dL (ref 30.0–36.0)
MCV: 79.1 fL (ref 78.0–100.0)
MONO ABS: 0.1 10*3/uL (ref 0.1–1.0)
Monocytes Relative: 3 %
NEUTROS PCT: 78 %
Neutro Abs: 3.4 10*3/uL (ref 1.7–7.7)
Platelets: 105 10*3/uL — ABNORMAL LOW (ref 150–400)
RBC: 3.82 MIL/uL — AB (ref 4.22–5.81)
RDW: 16.9 % — AB (ref 11.5–15.5)
WBC: 4.2 10*3/uL (ref 4.0–10.5)

## 2018-01-29 LAB — GLUCOSE, CAPILLARY
GLUCOSE-CAPILLARY: 138 mg/dL — AB (ref 70–99)
GLUCOSE-CAPILLARY: 159 mg/dL — AB (ref 70–99)
Glucose-Capillary: 167 mg/dL — ABNORMAL HIGH (ref 70–99)
Glucose-Capillary: 180 mg/dL — ABNORMAL HIGH (ref 70–99)

## 2018-01-29 LAB — BASIC METABOLIC PANEL
ANION GAP: 11 (ref 5–15)
BUN: 66 mg/dL — AB (ref 8–23)
CHLORIDE: 103 mmol/L (ref 98–111)
CO2: 25 mmol/L (ref 22–32)
CREATININE: 3.23 mg/dL — AB (ref 0.61–1.24)
Calcium: 7.4 mg/dL — ABNORMAL LOW (ref 8.9–10.3)
GFR, EST AFRICAN AMERICAN: 21 mL/min — AB (ref >60)
GFR, EST NON AFRICAN AMERICAN: 18 mL/min — AB (ref >60)
GLUCOSE: 165 mg/dL — AB (ref 70–99)
POTASSIUM: 3.9 mmol/L (ref 3.5–5.1)
Sodium: 139 mmol/L (ref 135–145)

## 2018-01-29 LAB — VALPROIC ACID LEVEL: Valproic Acid Lvl: 53 ug/mL (ref 50.0–100.0)

## 2018-01-29 MED ORDER — INSULIN ASPART 100 UNIT/ML ~~LOC~~ SOLN
0.0000 [IU] | SUBCUTANEOUS | Status: DC
  Administered 2018-01-29: 2 [IU] via SUBCUTANEOUS
  Administered 2018-01-30 (×4): 1 [IU] via SUBCUTANEOUS
  Administered 2018-01-30: 2 [IU] via SUBCUTANEOUS
  Administered 2018-01-30: 1 [IU] via SUBCUTANEOUS
  Administered 2018-01-31 (×2): 2 [IU] via SUBCUTANEOUS
  Administered 2018-01-31: 1 [IU] via SUBCUTANEOUS
  Administered 2018-01-31: 2 [IU] via SUBCUTANEOUS
  Administered 2018-02-01 (×2): 1 [IU] via SUBCUTANEOUS

## 2018-01-29 NOTE — Progress Notes (Signed)
NAME:  Andrew Lewis, MRN:  [id number], DOB:  November 25, 1945, LOS: 5 ADMISSION DATE:  02/07/2018, CONSULTATION DATE:  02/08/2018 REFERRING MD:  A. Zenia Resides (ED), CHIEF COMPLAINT:  S/P V-fib arrest   Brief History   Mr. Andrew Lewis is a 72 y.o. B M brought to [facility] ED following a V-fib arrest. Per the patient's wife, the were en route back to their home in [place], [place] from [place] and stopped at [employer] in [place]. After eating and getting back into their car, the patient suddenly became unresponsive without a prior complaint of chest pain, SOB or noticeable lateralizing signs or symptoms. EMS was called, arrived within 15 min and found the patient apneic and pulseless in V-fib arrest. He received shock x 3, epi x 2 and arrived on Epi drip; Amiodarone 300mg  was also given prior to arrival. He reportedly had intermittent pulses during the 30 min transport to [facility] with approximate down time to initial ROSC of 7 min. ECG post arrival without evidence of STEMI and TnI was 0.03.  The wife states that the patient has a hx HTN, DM and an arrhythmia in the past, for which he was on anticoagulants. He does see a cardiologist in [place]. He also has a hx OSA and uses CPAP Significant Hospital Events   9/16 patient undergoing TTM protocol at [facility]. Started last night. Left IJ catheter inserted for levophend to maintain MAP > 48mmHg.  9/17: Temperature management protocol discontinued at 7 AM with neuromuscular blockade stopped.  Lip twitching and eye movement worrisome for possible status.  Stat EEG and neurology consult requested.  Remains in shock with escalating pressor requirements, worsening acidosis, and worsening endorgan function.  Pancultured.  Empiric Unasyn started.  Bicarbonate infusion started for non-gap component of acidosis.  EEG showed: GPEDs started on Depakote, seizure activity did not resolve, placed on continuous EEG monitoring Versed titrated up 9/18: EEG still showing semi-periodic epileptiform  discharges in bilateral frontotemporal region, but no seizure.  Versed infusion continued.  Still in shock on vasoactive drips.  Renal function worse.  With wife, and 3 daughters.  Goals of care discussed.  May DO NOT RESUSCITATE.  Decided against dialysis.  Goal is to continue supportive care with and watch for neurological recovery over the next several days and if no improvement likely transition to palliative approach 9/19: Remains unresponsive off from all sedating drips 9/20: Levophed drip discontinued remains unresponsive no sedation for over 24 hours  Consults: date of consult/date signed off & final recs:  Cardiology 9/15: will consider cardiac cath when and if meaningful recovery is achieved. Neurology consulted on 9/17 for possible seizure  Procedures (surgical and bedside):  Intubation: 9/15 CVC: 9/15  Significant Diagnostic Tests:  CT Head: 9/15: neg EEG 9/16: burst suppression. No seizure  Echocardiogram 9/16:Ejection fraction 20 to 25% cavity mildly dilated systolic function severely reduced EEG 9/17: GPEDs Renal ultrasound 9/17>>> medical renal disease no hydro-nephrosis.  There is a 2.3 cm lesion on left kidney upper pole could represent cyst FENa: 0.33 EEG 9/18: Severe slowing.  Semi-periodic epileptiform discharges from the bilateral frontotemporal region seizures Micro Data: Blood culture times two 9/17 Urine culture 9/17 Sputum culture 9/17: Normal flora  Antimicrobials:  Unasyn 9/17>>>  Subjective:  Unresponsive, unable to elicit Objective   Blood pressure 134/68, pulse 88, temperature 97.7 F (36.5 C), resp. rate 19, height 5\' 9"  (1.753 m), weight 94.7 kg, SpO2 100 %. CVP:  [8 mmHg-15 mmHg] 10 mmHg  Vent Mode: PRVC FiO2 (%):  [30 %]  30 % Set Rate:  [12 bmp] 12 bmp Vt Set:  [570 mL] 570 mL PEEP:  [5 cmH20] 5 cmH20 Plateau Pressure:  [18 cmH20-25 cmH20] 25 cmH20   Intake/Output Summary (Last 24 hours) at 01/29/2018 1006 Last data filed at 01/29/2018  0800 Gross per 24 hour  Intake 1795.02 ml  Output 1755 ml  Net 40.02 ml   Filed Weights   01/27/18 0500 01/28/18 0500 01/29/18 0421  Weight: 95.6 kg 92.6 kg 94.7 kg    Examination: General: 73 year old African-American male.  He is currently unresponsive on mechanical ventilation HEENT: Normocephalic atraumatic mild scleral edema orally intubated Pulmonary: Diminished bases equal chest rise on mechanically assisted breath Cardiac: Regular rate and rhythm Abdomen: Soft nontender liquid stool from Flexi-Seal positive bowel sounds GU: Clear yellow Neuro: GCS 3 and occasional cough with deep suctioning Extremities: Warm, dry, dependent edema  Resolved/inactive Hospital Problem list   Obstructive sleep apnea Right lower extremity externally rotated (not a candidate for further evaluation given neurological insult) Mixed anion gap and non-anion gap metabolic acidosis (resolved 9/19) Lactic acidosis Cardiogenic shock resolved as of 9/20 Assessment & Plan:   S/P V-fib arrest in setting of known nonischemic cardiomyopathy.  He has an ejection fraction 10%  -There is off Plan Keep euvolemic Continue telemetry monitoring Doubtful ischemic evaluation will be warranted given evidence of neurological injury  Acute hypoxic respiratory failure status post cardiac arrest, with right-sided pulmonary infiltrate and pleural effusion -Portable chest x-ray personally reviewed:right basilar airspace disease which looks to be a mix of atelectasis and effusion, perhaps a little better aerated comparing prior film -Mental status is major barrier to extubation -Sputum consistent with normal flora Plan Continuing full ventilator support  VAP bundle  Day #3 Unasyn, will complete 5-day total  PRN chest x-ray   Acute metabolic encephalopathy, presumed hypoxic encephalopathy. Seizure activity/GPEDs -No further seizures as of 9/19 -Unresponsive, sedations been off for over 24 hours as of  9/20 Plan PAD protocol, RASS goal 0 No change in valproic acid Treat fevers Further recommendations per neurology, defer decision regarding MRI to them   Acute on chronic renal failure -Urine output picking up, creatinine improved Plan Avoid hypotension Renal dose medications Not a candidate for dialysis given discussion with family on 9/18  Intermittent fluid and electrolyte balance Plan Follow-up a.m. chemistry  Type II DM. Plan Sliding scale insulin  Mild anemia without evidence of bleeding Plan Continue to trend CBC  Mild thrombocytopenia Plan Continue to trend CBC  Disposition / Summary of Today's Plan 01/29/18   Continues to be unresponsive and comatose.  Now off pressors.  Increasingly concerned that we are dealing with a devastating neurological insult.  We will continue supportive care over the weekend, will need to revisit this with family.  Given discussion on the 18th I suspect will transition to palliation if no improvement   Diet: npo-->start tubefeeds 9/17 Pain/Anxiety/Delirium protocol (if indicated): PAD goal 0 effective 9/19 VAP protocol (if indicated): Ordered DVT prophylaxis: sub q heparin GI prophylaxis: H2 blocker Hyperglycemia protocol: s/s insulin Mobility:bedbound Code Status: He is now full DO NOT RESUSCITATE, we will not escalate care, he is not a dialysis candidate as confirmed by his wife and 3 daughters by goals of care discussion carried out on 9/18 Family Communication: discussed care plan at the bedside with the wife.  Labs   CBC: Recent Labs  Lab 01/12/2018 1457  01/25/18 0208 01/25/18 0310 01/25/18 0503 01/27/18 0431 01/29/18 0417  WBC 7.0  --   --   --  3.0* 5.5 4.2  NEUTROABS 1.1*  --   --   --   --  3.1 3.4  HGB 11.1*   < > 11.9* 11.9* 11.1* 10.4* 9.8*  HCT 36.6*   < > 35.0* 35.0* 34.5* 32.2* 30.2*  MCV 84.7  --   --   --  80.6 79.7 79.1  PLT 131*  --   --   --  144* 136* 105*   < > = values in this interval not  displayed.   Basic Metabolic Panel: Recent Labs  Lab 01/25/18 0503  01/26/18 0915  01/26/18 1540 01/27/18 0431 01/27/18 1235 01/27/18 1629 01/28/18 0506 01/29/18 0417  NA 136   < >  --    < >  --  139 140 139 138 139  K 4.4   < >  --    < >  --  4.3 4.0 3.9 5.0 3.9  CL 107   < >  --    < >  --  107 105 104 102 103  CO2 16*   < >  --    < >  --  19* 24 22 24 25   GLUCOSE 140*   < >  --    < >  --  114* 140* 160* 158* 165*  BUN 44*   < >  --    < >  --  51* 55* 56* 62* 66*  CREATININE 2.36*   < >  --    < >  --  3.03* 3.27* 3.33* 3.41* 3.23*  CALCIUM 7.6*   < >  --    < >  --  6.7* 6.9* 6.9* 6.8* 7.4*  MG 2.0  --  1.9  --  1.8 1.7  --  2.1  --   --   PHOS 2.4*  --  4.3  --  4.3 4.1  --  3.9  --   --    < > = values in this interval not displayed.   GFR: Estimated Creatinine Clearance: 23.5 mL/min (A) (by C-G formula based on SCr of 3.23 mg/dL (H)). Recent Labs  Lab 01/10/2018 1455 01/13/2018 1457 01/21/2018 1711 01/25/18 0503 01/26/18 0932 01/27/18 0431 01/27/18 1235 01/29/18 0417  WBC  --  7.0  --  3.0*  --  5.5  --  4.2  LATICACIDVEN 4.37*  --  3.99*  --  3.4*  --  1.8  --    Liver Function Tests: Recent Labs  Lab 01/13/2018 1457 01/27/18 0431 01/27/18 1235 01/27/18 1629 01/28/18 0506  AST 26 180* 177* 176* 140*  ALT 17 92* 104* 111* 101*  ALKPHOS 135* 130* 124 131* 138*  BILITOT 1.1 1.0 1.1 1.0 1.3*  PROT 5.5* 5.1* 5.1* 5.3* 5.3*  ALBUMIN 2.0* 1.7* 1.6* 1.6* 1.6*   No results for input(s): LIPASE, AMYLASE in the last 168 hours. No results for input(s): AMMONIA in the last 168 hours. ABG    Component Value Date/Time   PHART 7.439 01/28/2018 0612   PCO2ART 43.2 01/28/2018 0612   PO2ART 119.0 (H) 01/28/2018 0612   HCO3 29.4 (H) 01/28/2018 0612   TCO2 31 01/28/2018 0612   ACIDBASEDEF 1.0 01/27/2018 1641   O2SAT 99.0 01/28/2018 0612    Coagulation Profile: Recent Labs  Lab 02/02/2018 1505 02/02/2018 2259  INR 1.45 1.54   Cardiac Enzymes: Recent Labs  Lab  01/26/2018 1457 01/22/2018 1705 01/14/2018 2256 01/25/18 0503 01/25/18 1053  TROPONINI 0.03* 0.29* 1.90* 1.92* 1.29*   HbA1C:  No results found for: HGBA1C CBG: Recent Labs  Lab 01/28/18 1143 01/28/18 1602 01/28/18 2039 01/28/18 2354 01/29/18 0814  GLUCAP 140* 111* 146* 138* [place] ACNP-BC [place] Pager # [phone] OR # [phone] if no answer

## 2018-01-29 NOTE — Progress Notes (Signed)
Progress Note  Patient Name: Andrew Lewis Date of Encounter: 01/29/2018  Primary Cardiologist: No primary care provider on file. Dr. Ashby Dawes [facility] in [place], [place])  Subjective   Intubated. Not responsive.   Inpatient Medications    Scheduled Meds: . aspirin  81 mg Per Tube Daily  . chlorhexidine gluconate (MEDLINE KIT)  15 mL Mouth Rinse BID  . Chlorhexidine Gluconate Cloth  6 each Topical Daily  . famotidine  20 mg Per Tube Daily  . feeding supplement (PRO-STAT SUGAR FREE 64)  30 mL Per Tube TID  . free water  200 mL Per Tube Q6H  . heparin  5,000 Units Subcutaneous Q8H  . mouth rinse  15 mL Mouth Rinse 10 times per day   Continuous Infusions: . sodium chloride Stopped (01/28/18 2256)  . sodium chloride    . ampicillin-sulbactam (UNASYN) IV Stopped (01/28/18 2043)  . feeding supplement (VITAL AF 1.2 CAL) 50 mL/hr at 01/28/18 2300  . norepinephrine (LEVOPHED) Adult infusion 4 mcg/min (01/29/18 0500)  . valproate sodium Stopped (01/28/18 2356)   PRN Meds: Place/Maintain arterial line **AND** sodium chloride, pneumococcal 23 valent vaccine, sodium chloride flush   Vital Signs    Vitals:   01/29/18 0300 01/29/18 0400 01/29/18 0421 01/29/18 0500  BP: (!) 153/86 (!) 143/86  138/82  Pulse: 90 84  79  Resp: 10 11  10   Temp: (!) 97.3 F (36.3 C) (!) 97.3 F (36.3 C)  97.7 F (36.5 C)  TempSrc:      SpO2: 100% 100%  100%  Weight:   94.7 kg   Height:        Intake/Output Summary (Last 24 hours) at 01/29/2018 0656 Last data filed at 01/29/2018 0500 Gross per 24 hour  Intake 2054.74 ml  Output 1730 ml  Net 324.74 ml   Filed Weights   01/27/18 0500 01/28/18 0500 01/29/18 0421  Weight: 95.6 kg 92.6 kg 94.7 kg    Telemetry    sinus- Personally Reviewed  ECG     No AM EKG- Personally Reviewed  Physical Exam   General: Intubated, not responsive.  HEENT: OP clear, mucus membranes moist  SKIN: warm, dry. No rashes. Neuro: unable to obtain    Neck: No JVD Lungs:Clear bilaterally, no wheezes, rhonci, crackles Cardiovascular: Regular rate and rhythm. No murmurs, gallops or rubs. Abdomen:Soft. Bowel sounds present.  Extremities: No lower extremity edema.     Labs    Chemistry Recent Labs  Lab 01/27/18 1235 01/27/18 1629 01/28/18 0506 01/29/18 0417  NA 140 139 138 139  K 4.0 3.9 5.0 3.9  CL 105 104 102 103  CO2 24 22 24 25   GLUCOSE 140* 160* 158* 165*  BUN 55* 56* 62* 66*  CREATININE 3.27* 3.33* 3.41* 3.23*  CALCIUM 6.9* 6.9* 6.8* 7.4*  PROT 5.1* 5.3* 5.3*  --   ALBUMIN 1.6* 1.6* 1.6*  --   AST 177* 176* 140*  --   ALT 104* 111* 101*  --   ALKPHOS 124 131* 138*  --   BILITOT 1.1 1.0 1.3*  --   GFRNONAA 17* 17* 17* 18*  GFRAA 20* 20* 19* 21*  ANIONGAP 11 13 12 11      Hematology Recent Labs  Lab 01/25/18 0503 01/27/18 0431 01/29/18 0417  WBC 3.0* 5.5 4.2  RBC 4.28 4.04* 3.82*  HGB 11.1* 10.4* 9.8*  HCT 34.5* 32.2* 30.2*  MCV 80.6 79.7 79.1  MCH 25.9* 25.7* 25.7*  MCHC 32.2 32.3 32.5  RDW 16.2* 16.5*  16.9*  PLT 144* 136* PENDING    Cardiac Enzymes Recent Labs  Lab 01/20/2018 1705 01/27/2018 2256 01/25/18 0503 01/25/18 1053  TROPONINI 0.29* 1.90* 1.92* 1.29*    Recent Labs  Lab 01/30/2018 1446  TROPIPOC 0.01     BNPNo results for input(s): BNP, PROBNP in the last 168 hours.   DDimer No results for input(s): DDIMER in the last 168 hours.   Radiology    Dg Chest Port 1 View  Result Date: 01/28/2018 CLINICAL DATA:  Acute respiratory failure. EXAM: PORTABLE CHEST 1 VIEW COMPARISON:  Radiograph of January 27, 2018. FINDINGS: Stable cardiomegaly. Endotracheal and nasogastric tubes are unchanged in position. Left internal jugular catheter is noted with distal tip in expected position of left brachiocephalic vein. No pneumothorax is noted. Stable mild right pleural effusion is noted with associated atelectasis or infiltrate in right lung base. Left lung is unremarkable. IMPRESSION: Stable support  apparatus. Stable right basilar opacity and pleural effusion as described above. Electronically Signed   By: Marijo Conception, M.D.   On: 01/28/2018 09:47    Cardiac Studies   Echo 01/25/18: - Left ventricle: The cavity size was moderately dilated. Systolic   function was severely reduced. The estimated ejection fraction   was in the range of 20% to 25%. Diffuse hypokinesis. The study is   not technically sufficient to allow evaluation of LV diastolic   function. - Aortic valve: Trileaflet; mildly thickened leaflets. There was   mild regurgitation. - Mitral valve: Calcified annulus. Mildly thickened leaflets .   There was moderate regurgitation. - Left atrium: The atrium was moderately to severely dilated.   Volume/bsa, ES, (1-plane Simpson&'s, A2C): 49.6 ml/m^2. - Right ventricle: Systolic function was moderately reduced. - Right atrium: The atrium was moderately dilated. - Tricuspid valve: There was moderate regurgitation. - Pulmonary arteries: Systolic pressure was moderately increased.   PA peak pressure: 54 mm Hg (S).  Patient Profile     72 y.o. male with known NICM with LVEF=10% by echo 01/21/18, CKD, prior PE on  Coumadin, HTN, HLD admitted following out of hospital VF arrest. Pt is followed by cardiology in [place], [place] and has refused ICD in the past.   Assessment & Plan    1. NICM/Ventricular fibrillation arrest: He is followed for a dilated NICM in [place]. He has refused ICD in the past. Now admitted with a VF arrest. Flat troponin trend with mild elevation, not suggestive of ACS. He was cooled. Echo 01/25/18 with LVEF=20-25%. This is unchanged from prior echo.  His neurological prognosis is poor. We will not see over the weekend unless needed. We will see again on Monday. If he were to have neurological recovery, would need a cardiac cath and ICD.       For questions or updates, please contact [place] Please consult [email or web address] for contact info under          Signed, Lauree Chandler, MD  01/29/2018, 6:56 AM

## 2018-01-29 NOTE — Progress Notes (Signed)
Nutrition Follow-up  DOCUMENTATION CODES:   Not applicable  INTERVENTION:   Tube Feeding:  Continue current TF regimen Vital AF 1.2 @ 50 ml/hr Pro-Stat 30 mL TID Provides 1740 kcals, 135 g of protein and 972 mL of free water Meets 100% estimated protein needs, 103% calorie needs   NUTRITION DIAGNOSIS:   Inadequate oral intake related to inability to eat as evidenced by NPO status.  Being addressed via TF   GOAL:   Patient will meet greater than or equal to 90% of their needs  Met  MONITOR:   Vent status, Labs, Skin, Weight trends, I & O's  REASON FOR ASSESSMENT:   Consult Enteral/tube feeding initiation and management  ASSESSMENT:   72 yo male admitted post V.fib arrest in setting of known ischemic cardiomyopathy with EF 10%, and started on TTM, acute respiratory failure post arrest requiring intubation. PMH includes HTN, DM, OSA  Patient is currently intubated on ventilator support MV: 6.2 L/min Temp (24hrs), Avg:97.8 F (36.6 C), Min:95.5 F (35.3 C), Max:98.8 F (37.1 C)  Vital AF 1.2 @ 50 ml/hr, Pro-Stat 30 mL TID, free water 200 mL q 6 hours via OG tube  Rectal tube in place; weight trending up. Utilizing weight of 83.9 kg as EDW. NEt + 13 L per I/O flow sheet  Labs: Creatinine 3.23, BUN 66, CBGs 111-166 Meds: levophed   Diet Order:   Diet Order    None      EDUCATION NEEDS:   Not appropriate for education at this time  Skin:  Skin Assessment: Skin Integrity Issues: Skin Integrity Issues:: Other (Comment) Other: wound on chest, skin tear from pad removal  Last BM:  9/16  Height:   Ht Readings from Last 1 Encounters:  01/17/2018 5' 9"  (1.753 m)    Weight:   Wt Readings from Last 1 Encounters:  01/29/18 94.7 kg    Ideal Body Weight:  72.7 kg  BMI:  Body mass index is 30.83 kg/m.  Estimated Nutritional Needs:   Kcal:  1697 kcals   Protein:  125-140 gm  Fluid:  per MD   Kerman Passey MS, RD, LDN, CNSC [phone] Pager   [phone] Weekend/On-Call Pager

## 2018-01-30 LAB — COMPREHENSIVE METABOLIC PANEL
ALBUMIN: 1.6 g/dL — AB (ref 3.5–5.0)
ALT: 50 U/L — ABNORMAL HIGH (ref 0–44)
ANION GAP: 11 (ref 5–15)
AST: 54 U/L — AB (ref 15–41)
Alkaline Phosphatase: 184 U/L — ABNORMAL HIGH (ref 38–126)
BUN: 77 mg/dL — ABNORMAL HIGH (ref 8–23)
CO2: 26 mmol/L (ref 22–32)
Calcium: 7.7 mg/dL — ABNORMAL LOW (ref 8.9–10.3)
Chloride: 104 mmol/L (ref 98–111)
Creatinine, Ser: 3.16 mg/dL — ABNORMAL HIGH (ref 0.61–1.24)
GFR calc Af Amer: 21 mL/min — ABNORMAL LOW (ref >60)
GFR calc non Af Amer: 18 mL/min — ABNORMAL LOW (ref >60)
GLUCOSE: 163 mg/dL — AB (ref 70–99)
POTASSIUM: 3.9 mmol/L (ref 3.5–5.1)
SODIUM: 141 mmol/L (ref 135–145)
Total Bilirubin: 1.1 mg/dL (ref 0.3–1.2)
Total Protein: 5.5 g/dL — ABNORMAL LOW (ref 6.5–8.1)

## 2018-01-30 LAB — GLUCOSE, CAPILLARY
GLUCOSE-CAPILLARY: 140 mg/dL — AB (ref 70–99)
GLUCOSE-CAPILLARY: 148 mg/dL — AB (ref 70–99)
GLUCOSE-CAPILLARY: 158 mg/dL — AB (ref 70–99)
Glucose-Capillary: 137 mg/dL — ABNORMAL HIGH (ref 70–99)
Glucose-Capillary: 146 mg/dL — ABNORMAL HIGH (ref 70–99)
Glucose-Capillary: 139 mg/dL — ABNORMAL HIGH (ref 70–99)

## 2018-01-30 MED ORDER — SODIUM CHLORIDE 0.9 % IV SOLN
INTRAVENOUS | Status: DC | PRN
  Administered 2018-01-30: 1000 mL via INTRAVENOUS
  Administered 2018-02-01: 100 mL via INTRAVENOUS

## 2018-01-30 NOTE — Progress Notes (Signed)
NAME:  Andrew Lewis, MRN:  [id number], DOB:  [birth year], LOS: 6 ADMISSION DATE:  01/23/2018, CONSULTATION DATE:  01/20/2018 REFERRING MD:  A. Zenia Resides (ED), CHIEF COMPLAINT:  S/P V-fib arrest   Brief History   Andrew Lewis is a 72 y.o. B M brought to [facility] ED following a V-fib arrest. Per the patient's wife, the were en route back to their home in [place], [place] from [place] and stopped at [employer] in [place]. After eating and getting back into their car, the patient suddenly became unresponsive without a prior complaint of chest pain, SOB or noticeable lateralizing signs or symptoms. EMS was called, arrived within 15 min and found the patient apneic and pulseless in V-fib arrest. He received shock x 3, epi x 2 and arrived on Epi drip; Amiodarone 300mg  was also given prior to arrival. He reportedly had intermittent pulses during the 30 min transport to [facility] with approximate down time to initial ROSC of 7 min. ECG post arrival without evidence of STEMI and TnI was 0.03.  The wife states that the patient has a hx HTN, DM and an arrhythmia in the past, for which he was on anticoagulants. He does see a cardiologist in [place]. He also has a hx OSA and uses CPAP Significant Hospital Events   9/16 patient undergoing TTM protocol at [facility]. Started last night. Left IJ catheter inserted for levophend to maintain MAP > 56mmHg.  9/17: Temperature management protocol discontinued at 7 AM with neuromuscular blockade stopped.  Lip twitching and eye movement worrisome for possible status.  Stat EEG and neurology consult requested.  Remains in shock with escalating pressor requirements, worsening acidosis, and worsening endorgan function.  Pancultured.  Empiric Unasyn started.  Bicarbonate infusion started for non-gap component of acidosis.  EEG showed: GPEDs started on Depakote, seizure activity did not resolve, placed on continuous EEG monitoring Versed titrated up 9/18: EEG still showing semi-periodic epileptiform  discharges in bilateral frontotemporal region, but no seizure.  Versed infusion continued.  Still in shock on vasoactive drips.  Renal function worse.  With wife, and 3 daughters.  Goals of care discussed.  May DO NOT RESUSCITATE.  Decided against dialysis.  Goal is to continue supportive care with and watch for neurological recovery over the next several days and if no improvement likely transition to palliative approach 9/19: Remains unresponsive off from all sedating drips 9/20: Levophed drip discontinued remains unresponsive no sedation for over 24 hours 9/21: remains unresponsive.  Consults: date of consult/date signed off & final recs:  Cardiology 9/15: will consider cardiac cath when and if meaningful recovery is achieved. Neurology consulted on 9/17 for possible seizure  Procedures (surgical and bedside):  Intubation: 9/15 CVC: 9/15  Significant Diagnostic Tests:  CT Head: 9/15: neg EEG 9/16: burst suppression. No seizure  Echocardiogram 9/16:Ejection fraction 20 to 25% cavity mildly dilated systolic function severely reduced EEG 9/17: GPEDs Renal ultrasound 9/17>>> medical renal disease no hydro-nephrosis.  There is a 2.3 cm lesion on left kidney upper pole could represent cyst FENa: 0.33 EEG 9/18: Severe slowing.  Semi-periodic epileptiform discharges from the bilateral frontotemporal region seizures Micro Data: Blood culture times two 9/17 Urine culture 9/17 Sputum culture 9/17: Normal flora  Antimicrobials:  Unasyn 9/17>>>  Subjective:  Unresponsive, unable to elicit Objective   Blood pressure 119/66, pulse 79, temperature 98.4 F (36.9 C), resp. rate 15, height 5\' 9"  (1.753 m), weight 94.1 kg, SpO2 100 %. CVP:  [1 mmHg-20 mmHg] 1 mmHg  Vent Mode: PRVC FiO2 (%):  [  30 %] 30 % Set Rate:  [12 bmp] 12 bmp Vt Set:  [570 mL] 570 mL PEEP:  [5 cmH20] 5 cmH20 Plateau Pressure:  [17 cmH20-26 cmH20] 22 cmH20   Intake/Output Summary (Last 24 hours) at 01/30/2018 1348 Last  data filed at 01/30/2018 1200 Gross per 24 hour  Intake 3456.2 ml  Output 890 ml  Net 2566.2 ml   Filed Weights   01/28/18 0500 01/29/18 0421 01/30/18 0500  Weight: 92.6 kg 94.7 kg 94.1 kg    Examination: General: 72 year old African-American male.  He is currently unresponsive on mechanical ventilation. HEENT: ETT/OGT. PERL. No icterus or jvd. Pulmonary: clear to auscultation. No wheezes, crackles or rhonchi. Cardiac: Regular rate and rhythm. No murmur, gallop or rub. Abdomen: Soft nontender liquid stool from Flexi-Seal positive bowel sounds. No organomegaly. GU: foley catheter. Neuro: GCS 3 and occasional cough with deep suctioning. Withdraws LUE only to tactile stimulation. Extremities: Warm, dry, dependent edema  Resolved/inactive Hospital Problem list   Obstructive sleep apnea Right lower extremity externally rotated (not a candidate for further evaluation given neurological insult) Mixed anion gap and non-anion gap metabolic acidosis (resolved 9/19) Lactic acidosis Cardiogenic shock resolved as of 9/20 Assessment & Plan:   S/P V-fib arrest in setting of known nonischemic cardiomyopathy.  He has an ejection fraction 10%   Plan Keep euvolemic Continue telemetry monitoring Doubtful ischemic evaluation will be warranted given evidence of neurological injury  Acute hypoxic respiratory failure status post cardiac arrest, with right-sided pulmonary infiltrate and pleural effusion  Plan Continuing full ventilator support, pending prognostication of neurological outcome. VAP bundle  To complete 5 days of Unasyn PRN chest x-ray   Acute metabolic encephalopathy, presumed hypoxic encephalopathy. Seizure activity/GPEDs -No further seizures as of 9/19 -Unresponsive, sedations been off for over 24 hours as of 9/20 Plan PAD protocol, RASS goal 0 No change in valproic acid Treat fevers Further recommendations per neurology; plan is to evaluate on 9/22 and obtain mri if no  clinical improvement.   Acute on chronic renal failure Monitor renal function and U.O.  Plan Avoid hypotension Renal dose medications Not a candidate for dialysis given discussion with family on 9/18  Intermittent fluid and electrolyte balance Plan Check ionized calcium level.  Type II DM. Plan Sliding scale insulin  Mild anemia without evidence of bleeding Plan Continue to trend CBC  Mild thrombocytopenia Plan Continue to trend CBC  Disposition / Summary of Today's Plan 01/30/18   Await further recommendations from nephrology. Continue supportive care.   Diet: enteral feeding. Pain/Anxiety/Delirium protocol (if indicated): PAD goal 0 effective 9/19 VAP protocol (if indicated): Ordered DVT prophylaxis: sub q heparin GI prophylaxis: H2 blocker Hyperglycemia protocol: s/s insulin Mobility:bedbound Code Status: He is now full DO NOT RESUSCITATE, we will not escalate care, he is not a dialysis candidate as confirmed by his wife and 3 daughters by goals of care discussion carried out on 9/18 Family Communication: no one available today.  Labs   CBC: Recent Labs  Lab 01/21/2018 1457  01/25/18 0208 01/25/18 0310 01/25/18 0503 01/27/18 0431 01/29/18 0417  WBC 7.0  --   --   --  3.0* 5.5 4.2  NEUTROABS 1.1*  --   --   --   --  3.1 3.4  HGB 11.1*   < > 11.9* 11.9* 11.1* 10.4* 9.8*  HCT 36.6*   < > 35.0* 35.0* 34.5* 32.2* 30.2*  MCV 84.7  --   --   --  80.6 79.7 79.1  PLT 131*  --   --   --  144* 136* 105*   < > = values in this interval not displayed.   Basic Metabolic Panel: Recent Labs  Lab 01/25/18 0503  01/26/18 0915  01/26/18 1540 01/27/18 0431 01/27/18 1235 01/27/18 1629 01/28/18 0506 01/29/18 0417 01/30/18 0542  NA 136   < >  --    < >  --  139 140 139 138 139 141  K 4.4   < >  --    < >  --  4.3 4.0 3.9 5.0 3.9 3.9  CL 107   < >  --    < >  --  107 105 104 102 103 104  CO2 16*   < >  --    < >  --  19* 24 22 24 25 26   GLUCOSE 140*   < >  --    < >   --  114* 140* 160* 158* 165* 163*  BUN 44*   < >  --    < >  --  51* 55* 56* 62* 66* 77*  CREATININE 2.36*   < >  --    < >  --  3.03* 3.27* 3.33* 3.41* 3.23* 3.16*  CALCIUM 7.6*   < >  --    < >  --  6.7* 6.9* 6.9* 6.8* 7.4* 7.7*  MG 2.0  --  1.9  --  1.8 1.7  --  2.1  --   --   --   PHOS 2.4*  --  4.3  --  4.3 4.1  --  3.9  --   --   --    < > = values in this interval not displayed.   GFR: Estimated Creatinine Clearance: 23.9 mL/min (A) (by C-G formula based on SCr of 3.16 mg/dL (H)). Recent Labs  Lab 01/27/2018 1455 01/11/2018 1457 01/16/2018 1711 01/25/18 0503 01/26/18 0932 01/27/18 0431 01/27/18 1235 01/29/18 0417  WBC  --  7.0  --  3.0*  --  5.5  --  4.2  LATICACIDVEN 4.37*  --  3.99*  --  3.4*  --  1.8  --    Liver Function Tests: Recent Labs  Lab 01/27/18 0431 01/27/18 1235 01/27/18 1629 01/28/18 0506 01/30/18 0542  AST 180* 177* 176* 140* 54*  ALT 92* 104* 111* 101* 50*  ALKPHOS 130* 124 131* 138* 184*  BILITOT 1.0 1.1 1.0 1.3* 1.1  PROT 5.1* 5.1* 5.3* 5.3* 5.5*  ALBUMIN 1.7* 1.6* 1.6* 1.6* 1.6*   No results for input(s): LIPASE, AMYLASE in the last 168 hours. No results for input(s): AMMONIA in the last 168 hours. ABG    Component Value Date/Time   PHART 7.439 01/28/2018 0612   PCO2ART 43.2 01/28/2018 0612   PO2ART 119.0 (H) 01/28/2018 0612   HCO3 29.4 (H) 01/28/2018 0612   TCO2 31 01/28/2018 0612   ACIDBASEDEF 1.0 01/27/2018 1641   O2SAT 99.0 01/28/2018 0612    Coagulation Profile: Recent Labs  Lab 01/21/2018 1505 01/13/2018 2259  INR 1.45 1.54   Cardiac Enzymes: Recent Labs  Lab 01/14/2018 1457 01/12/2018 1705 01/23/2018 2256 01/25/18 0503 01/25/18 1053  TROPONINI 0.03* 0.29* 1.90* 1.92* 1.29*   HbA1C: No results found for: HGBA1C CBG: Recent Labs  Lab 01/29/18 2016 01/30/18 0029 01/30/18 0419 01/30/18 0751 01/30/18 1148  GLUCAP 180* 158* 137* 148* 146*    CRITICAL CARE Performed by: Jesus Genera   Total critical care time: 34  minutes  Critical care time was exclusive of separately billable procedures  and treating other patients.  Critical care was necessary to treat or prevent imminent or life-threatening deterioration.  Critical care was time spent personally by me on the following activities: development of treatment plan with patient and/or surrogate as well as nursing, discussions with consultants, evaluation of patient's response to treatment, examination of patient, obtaining history from patient or surrogate, ordering and performing treatments and interventions, ordering and review of laboratory studies, ordering and review of radiographic studies, pulse oximetry and re-evaluation of patient's condition.

## 2018-01-31 LAB — GLUCOSE, CAPILLARY
GLUCOSE-CAPILLARY: 124 mg/dL — AB (ref 70–99)
GLUCOSE-CAPILLARY: 152 mg/dL — AB (ref 70–99)
GLUCOSE-CAPILLARY: 156 mg/dL — AB (ref 70–99)
GLUCOSE-CAPILLARY: 161 mg/dL — AB (ref 70–99)
Glucose-Capillary: 127 mg/dL — ABNORMAL HIGH (ref 70–99)
Glucose-Capillary: 149 mg/dL — ABNORMAL HIGH (ref 70–99)
Glucose-Capillary: 162 mg/dL — ABNORMAL HIGH (ref 70–99)

## 2018-01-31 LAB — BASIC METABOLIC PANEL
Anion gap: 13 (ref 5–15)
BUN: 96 mg/dL — ABNORMAL HIGH (ref 8–23)
CO2: 24 mmol/L (ref 22–32)
Calcium: 8.1 mg/dL — ABNORMAL LOW (ref 8.9–10.3)
Chloride: 103 mmol/L (ref 98–111)
Creatinine, Ser: 3.36 mg/dL — ABNORMAL HIGH (ref 0.61–1.24)
GFR calc Af Amer: 20 mL/min — ABNORMAL LOW (ref >60)
GFR calc non Af Amer: 17 mL/min — ABNORMAL LOW (ref >60)
Glucose, Bld: 155 mg/dL — ABNORMAL HIGH (ref 70–99)
Potassium: 3.9 mmol/L (ref 3.5–5.1)
Sodium: 140 mmol/L (ref 135–145)

## 2018-01-31 LAB — MAGNESIUM: Magnesium: 2.1 mg/dL (ref 1.7–2.4)

## 2018-01-31 LAB — PHOSPHORUS: Phosphorus: 4.4 mg/dL (ref 2.5–4.6)

## 2018-01-31 LAB — CALCIUM, IONIZED: Calcium, Ionized, Serum: 4.7 mg/dL (ref 4.5–5.6)

## 2018-01-31 MED ORDER — VALPROIC ACID 250 MG/5ML PO SOLN
500.0000 mg | Freq: Two times a day (BID) | ORAL | Status: DC
  Administered 2018-01-31: 500 mg
  Filled 2018-01-31 (×2): qty 10

## 2018-01-31 NOTE — Progress Notes (Signed)
NAME:  Andrew Lewis, MRN:  [id number], DOB:  [birth year], LOS: 7 ADMISSION DATE:  01/30/2018, CONSULTATION DATE:  01/31/2018 REFERRING MD:  A. Zenia Resides (ED), CHIEF COMPLAINT:  S/P V-fib arrest   Brief History   Andrew Lewis is a 72 y.o. B M brought to [facility] ED following a V-fib arrest. Per the patient's wife, the were en route back to their home in [place], [place] from [place] and stopped at [employer] in [place]. After eating and getting back into their car, the patient suddenly became unresponsive without a prior complaint of chest pain, SOB or noticeable lateralizing signs or symptoms. EMS was called, arrived within 15 min and found the patient apneic and pulseless in V-fib arrest. He received shock x 3, epi x 2 and arrived on Epi drip; Amiodarone 300mg  was also given prior to arrival. He reportedly had intermittent pulses during the 30 min transport to [facility] with approximate down time to initial ROSC of 7 min. ECG post arrival without evidence of STEMI and TnI was 0.03.  The wife states that the patient has a hx HTN, DM and an arrhythmia in the past, for which he was on anticoagulants. He does see a cardiologist in [place]. He also has a hx OSA and uses CPAP Significant Hospital Events   9/16 patient undergoing TTM protocol at [facility]. Started last night. Left IJ catheter inserted for levophend to maintain MAP > 39mmHg.  9/17: Temperature management protocol discontinued at 7 AM with neuromuscular blockade stopped.  Lip twitching and eye movement worrisome for possible status.  Stat EEG and neurology consult requested.  Remains in shock with escalating pressor requirements, worsening acidosis, and worsening endorgan function.  Pancultured.  Empiric Unasyn started.  Bicarbonate infusion started for non-gap component of acidosis.  EEG showed: GPEDs started on Depakote, seizure activity did not resolve, placed on continuous EEG monitoring Versed titrated up 9/18: EEG still showing semi-periodic epileptiform  discharges in bilateral frontotemporal region, but no seizure.  Versed infusion continued.  Still in shock on vasoactive drips.  Renal function worse.  With wife, and 3 daughters.  Goals of care discussed.  May DO NOT RESUSCITATE.  Decided against dialysis.  Goal is to continue supportive care with and watch for neurological recovery over the next several days and if no improvement likely transition to palliative approach 9/19: Remains unresponsive off from all sedating drips 9/20: Levophed drip discontinued remains unresponsive no sedation for over 24 hours 9/21: remains unresponsive. 9/22. No change in level of consciousness.  Consults: date of consult/date signed off & final recs:  Cardiology 9/15: will consider cardiac cath when and if meaningful recovery is achieved. Neurology consulted on 9/17 for possible seizure  Procedures (surgical and bedside):  Intubation: 9/15 CVC: 9/15   Significant Diagnostic Tests:  CT Head: 9/15: neg EEG 9/16: burst suppression. No seizure  Echocardiogram 9/16:Ejection fraction 20 to 25% cavity mildly dilated systolic function severely reduced EEG 9/17: GPEDs Renal ultrasound 9/17>>> medical renal disease no hydro-nephrosis.  There is a 2.3 cm lesion on left kidney upper pole could represent cyst FENa: 0.33 EEG 9/18: Severe slowing.  Semi-periodic epileptiform discharges from the bilateral frontotemporal region seizures MRI 9/22 is pending.  Micro Data: Blood culture times two 9/17 Urine culture 9/17 Sputum culture 9/17: Normal flora  Antimicrobials:  Unasyn 9/17>>>  Subjective:  Unresponsive, unable to elicit Objective   Blood pressure (!) 157/93, pulse 92, temperature 99.5 F (37.5 C), resp. rate 16, height 5\' 9"  (1.753 m), weight 90.8 kg, SpO2  100 %. CVP:  [1 mmHg-25 mmHg] 25 mmHg  Vent Mode: PRVC FiO2 (%):  [30 %] 30 % Set Rate:  [12 bmp] 12 bmp Vt Set:  [570 mL] 570 mL PEEP:  [5 cmH20] 5 cmH20 Pressure Support:  [20 cmH20] 20  cmH20 Plateau Pressure:  [18 cmH20-28 cmH20] 18 cmH20   Intake/Output Summary (Last 24 hours) at 01/31/2018 1153 Last data filed at 01/31/2018 1100 Gross per 24 hour  Intake 1749.62 ml  Output 1045 ml  Net 704.62 ml   Filed Weights   01/29/18 0421 01/30/18 0500 01/31/18 0500  Weight: 94.7 kg 94.1 kg 90.8 kg    Examination: General: 72 year old African-American male.  He is currently unresponsive on mechanical ventilation. HEENT: ETT/OGT. PERL. No icterus or jvd. +corneal, pupillary and doll's eye reflexes. + cough to suction Pulmonary: clear to auscultation. No wheezes, crackles or rhonchi. Cardiac: Regular rate and rhythm. No murmur, gallop or rub. Abdomen: Soft nontender liquid stool from Flexi-Seal positive bowel sounds. No organomegaly. GU: foley catheter. Neuro: GCS 3 and occasional cough with deep suctioning. Withdraws LUE only to tactile stimulation. Extremities: Warm, dry, dependent edema  Resolved/inactive Hospital Problem list   Obstructive sleep apnea Right lower extremity externally rotated (not a candidate for further evaluation given neurological insult) Mixed anion gap and non-anion gap metabolic acidosis (resolved 9/19) Lactic acidosis Cardiogenic shock resolved as of 9/20 Assessment & Plan:   S/P V-fib arrest in setting of known nonischemic cardiomyopathy.  He has an ejection fraction 10%   Plan Keep euvolemic Continue telemetry monitoring Doubtful ischemic evaluation will be warranted given evidence of neurological injury  Acute hypoxic respiratory failure status post cardiac arrest, with right-sided pulmonary infiltrate and pleural effusion  Plan Continuing full ventilator support, pending prognostication of neurological outcome. VAP bundle  To complete 5 days of Unasyn PRN chest x-ray   Acute metabolic encephalopathy, presumed hypoxic encephalopathy. Seizure activity/GPEDs -No further seizures as of 9/19 -Unresponsive, sedations been off for over  24 hours as of 9/20 Plan PAD protocol, RASS goal 0 No change in valproic acid Treat fevers MRI 9/22. Poor prognosis by neurology noted.   Acute on chronic renal failure Monitor renal function and U.O.  Plan Avoid hypotension Renal dose medications Not a candidate for dialysis given discussion with family on 9/18 No decline in GFR. Urine output is non oliguric.  Intermittent fluid and electrolyte balance Plan Check ionized calcium level. Serum calcium corrected for albumin is 9.6 mg/dl  Type II DM. Plan Sliding scale insulin; adequate glycemic control.  Mild anemia without evidence of bleeding Plan Continue to trend CBC  Mild thrombocytopenia Plan Continue to trend CBC  Disposition / Summary of Today's Plan 01/31/18   Await further recommendations from nephrology. Continue supportive care.   Diet: enteral feeding. Pain/Anxiety/Delirium protocol (if indicated): PAD goal 0 effective 9/19 VAP protocol (if indicated): Ordered DVT prophylaxis: sub q heparin GI prophylaxis: H2 blocker Hyperglycemia protocol: s/s insulin Mobility:bedbound Code Status: He is now full DO NOT RESUSCITATE, we will not escalate care, he is not a dialysis candidate as confirmed by his wife and 3 daughters by goals of care discussion carried out on 9/18 Family Communication: no one available today.  Labs   CBC: Recent Labs  Lab 01/18/2018 1457  01/25/18 0208 01/25/18 0310 01/25/18 0503 01/27/18 0431 01/29/18 0417  WBC 7.0  --   --   --  3.0* 5.5 4.2  NEUTROABS 1.1*  --   --   --   --  3.1  3.4  HGB 11.1*   < > 11.9* 11.9* 11.1* 10.4* 9.8*  HCT 36.6*   < > 35.0* 35.0* 34.5* 32.2* 30.2*  MCV 84.7  --   --   --  80.6 79.7 79.1  PLT 131*  --   --   --  144* 136* 105*   < > = values in this interval not displayed.   Basic Metabolic Panel: Recent Labs  Lab 01/25/18 0503  01/26/18 0915  01/26/18 1540 01/27/18 0431 01/27/18 1235 01/27/18 1629 01/28/18 0506 01/29/18 0417 01/30/18 0542   NA 136   < >  --    < >  --  139 140 139 138 139 141  K 4.4   < >  --    < >  --  4.3 4.0 3.9 5.0 3.9 3.9  CL 107   < >  --    < >  --  107 105 104 102 103 104  CO2 16*   < >  --    < >  --  19* 24 22 24 25 26   GLUCOSE 140*   < >  --    < >  --  114* 140* 160* 158* 165* 163*  BUN 44*   < >  --    < >  --  51* 55* 56* 62* 66* 77*  CREATININE 2.36*   < >  --    < >  --  3.03* 3.27* 3.33* 3.41* 3.23* 3.16*  CALCIUM 7.6*   < >  --    < >  --  6.7* 6.9* 6.9* 6.8* 7.4* 7.7*  MG 2.0  --  1.9  --  1.8 1.7  --  2.1  --   --   --   PHOS 2.4*  --  4.3  --  4.3 4.1  --  3.9  --   --   --    < > = values in this interval not displayed.   GFR: Estimated Creatinine Clearance: 23.5 mL/min (A) (by C-G formula based on SCr of 3.16 mg/dL (H)). Recent Labs  Lab 01/10/2018 1455 01/20/2018 1457 01/23/2018 1711 01/25/18 0503 01/26/18 0932 01/27/18 0431 01/27/18 1235 01/29/18 0417  WBC  --  7.0  --  3.0*  --  5.5  --  4.2  LATICACIDVEN 4.37*  --  3.99*  --  3.4*  --  1.8  --    Liver Function Tests: Recent Labs  Lab 01/27/18 0431 01/27/18 1235 01/27/18 1629 01/28/18 0506 01/30/18 0542  AST 180* 177* 176* 140* 54*  ALT 92* 104* 111* 101* 50*  ALKPHOS 130* 124 131* 138* 184*  BILITOT 1.0 1.1 1.0 1.3* 1.1  PROT 5.1* 5.1* 5.3* 5.3* 5.5*  ALBUMIN 1.7* 1.6* 1.6* 1.6* 1.6*   No results for input(s): LIPASE, AMYLASE in the last 168 hours. No results for input(s): AMMONIA in the last 168 hours. ABG    Component Value Date/Time   PHART 7.439 01/28/2018 0612   PCO2ART 43.2 01/28/2018 0612   PO2ART 119.0 (H) 01/28/2018 0612   HCO3 29.4 (H) 01/28/2018 0612   TCO2 31 01/28/2018 0612   ACIDBASEDEF 1.0 01/27/2018 1641   O2SAT 99.0 01/28/2018 0612    Coagulation Profile: Recent Labs  Lab 01/13/2018 1505 02/04/2018 2259  INR 1.45 1.54   Cardiac Enzymes: Recent Labs  Lab 01/29/2018 1457 01/10/2018 1705 02/08/2018 2256 01/25/18 0503 01/25/18 1053  TROPONINI 0.03* 0.29* 1.90* 1.92* 1.29*   HbA1C: No  results found  for: HGBA1C CBG: Recent Labs  Lab 01/30/18 1554 01/30/18 2058 01/31/18 0019 01/31/18 0330 01/31/18 0753  GLUCAP 139* 140* 162* 152* 127*    CRITICAL CARE Performed by: Jesus Genera   Total critical care time: 32 minutes  Critical care time was exclusive of separately billable procedures and treating other patients.  Critical care was necessary to treat or prevent imminent or life-threatening deterioration.  Critical care was time spent personally by me on the following activities: development of treatment plan with patient and/or surrogate as well as nursing, discussions with consultants, evaluation of patient's response to treatment, examination of patient, obtaining history from patient or surrogate, ordering and performing treatments and interventions, ordering and review of laboratory studies, ordering and review of radiographic studies, pulse oximetry and re-evaluation of patient's condition.

## 2018-01-31 NOTE — Progress Notes (Signed)
Pt placed back on full vent support due to increased RR followed by a period of apnea.  RN notified.

## 2018-01-31 NOTE — Progress Notes (Signed)
Reason for consult:   Subjective:   ROS: negative except above  Examination  Vital signs in last 24 hours: Temp:  [95 F (35 C)-100.2 F (37.9 C)] 95 F (35 C) (09/22 0900) Pulse Rate:  [79-101] 101 (09/22 0900) Resp:  [10-27] 19 (09/22 0900) BP: (116-152)/(51-98) 152/82 (09/22 0900) SpO2:  [96 %-100 %] 100 % (09/22 0900) Arterial Line BP: (90-146)/(38-87) 130/53 (09/22 0900) FiO2 (%):  [30 %] 30 % (09/22 0845) Weight:  [90.8 kg] 90.8 kg (09/22 0500)  General: lying in bed CVS: pulse-normal rate and rhythm RS: breathing comfortably Extremities: normal   Neuro: Mental Status: Patient does not respond to verbal stimuli.  Does not respond to deep sternal rub.  Does not follow commands.  No verbalizations noted.  Cranial Nerves: II: patient does not respond confrontation bilaterally, pupils not reactive, right 4 mm, left 4 mm III,IV,VI: doll's response present bilaterally V,VII: corneal reflex: present VIII: patient does not respond to verbal stimuli IX,X: gag reflex present  XI: trapezius strength unable to test bilaterally XII: tongue strength unable to test Motor: Has flexor posturing.  No spontaneous movement noted.  No purposeful movements noted. Sensory: Does not respond to noxious stimuli in any extremity. Plantars: mute  Cerebellar: Unable to perform  Basic Metabolic Panel: Recent Labs  Lab 01/25/18 0503  01/26/18 0915  01/26/18 1540 01/27/18 0431 01/27/18 1235 01/27/18 1629 01/28/18 0506 01/29/18 0417 01/30/18 0542  NA 136   < >  --    < >  --  139 140 139 138 139 141  K 4.4   < >  --    < >  --  4.3 4.0 3.9 5.0 3.9 3.9  CL 107   < >  --    < >  --  107 105 104 102 103 104  CO2 16*   < >  --    < >  --  19* 24 22 24 25 26   GLUCOSE 140*   < >  --    < >  --  114* 140* 160* 158* 165* 163*  BUN 44*   < >  --    < >  --  51* 55* 56* 62* 66* 77*  CREATININE 2.36*   < >  --    < >  --  3.03* 3.27* 3.33* 3.41* 3.23* 3.16*  CALCIUM 7.6*   < >  --    < >  --   6.7* 6.9* 6.9* 6.8* 7.4* 7.7*  MG 2.0  --  1.9  --  1.8 1.7  --  2.1  --   --   --   PHOS 2.4*  --  4.3  --  4.3 4.1  --  3.9  --   --   --    < > = values in this interval not displayed.    CBC: Recent Labs  Lab 01/20/2018 1457  01/25/18 0208 01/25/18 0310 01/25/18 0503 01/27/18 0431 01/29/18 0417  WBC 7.0  --   --   --  3.0* 5.5 4.2  NEUTROABS 1.1*  --   --   --   --  3.1 3.4  HGB 11.1*   < > 11.9* 11.9* 11.1* 10.4* 9.8*  HCT 36.6*   < > 35.0* 35.0* 34.5* 32.2* 30.2*  MCV 84.7  --   --   --  80.6 79.7 79.1  PLT 131*  --   --   --  144* 136* 105*   < > =  values in this interval not displayed.     Coagulation Studies: No results for input(s): LABPROT, INR in the last 72 hours.  Imaging Reviewed:     ASSESSMENT AND PLAN  72 y.o.malewith history of pulmonary embolism, prolonged QT interval, nonsustained ventricular tachycardia, hypertension, hyperlipidemia, heart failure with a reduced ejection fractionwith Vfib arrest s/p hypothermia protocol. Repeat CT head: does not show diffuse cerebral edema  Stat EEG shows Generalized periodic epileptiform discharges with poor background.  LTM EEGshowed reduced amplitude of GPDs, no seizures. Sedation weaned and has been off for greater than 48 hrs.   Exam on Day 7 poor, absent pupillary response, however Doll's and corneal present. No spontaneous eye opening ( although family says they saw this). Flexor posturing.    Impression:   Anoxic Brain Injury s/p cardiac arrest, received hypothermia   Patient has made some improvement with some brainstem reflexes intact and mild flexor posturing. Based on his clinical exam on Day 7, he would likely have 0 percent recovery on Levy criteria prior to ear of hypothermia.  However with hypothermia, it may possible that it could  slightly be better. Given his multiple comorbidities and age, I think  it is very unlikely he will recover to have a good neurological prognosis.   Plan: MRI  Brain  Recommend goals of care discussion.    Karena Addison Chenille Toor [facility] Pager Number [phone] For questions after 7pm please refer to AMION to reach the Neurologist on call

## 2018-01-31 NOTE — Progress Notes (Signed)
Spoke with wife over the phone, communicated that he is unlikely going to have a good recovery, She seemed to understand.   Please call Neurology if family would like presence tomorrow. I will be off service tomorrow and will discuss this case with my colleague Dr Cheral Marker.

## 2018-02-01 ENCOUNTER — Inpatient Hospital Stay (HOSPITAL_COMMUNITY): Payer: Medicare HMO

## 2018-02-01 DIAGNOSIS — J9600 Acute respiratory failure, unspecified whether with hypoxia or hypercapnia: Secondary | ICD-10-CM

## 2018-02-01 LAB — GLUCOSE, CAPILLARY
GLUCOSE-CAPILLARY: 192 mg/dL — AB (ref 70–99)
Glucose-Capillary: 123 mg/dL — ABNORMAL HIGH (ref 70–99)
Glucose-Capillary: 106 mg/dL — ABNORMAL HIGH (ref 70–99)

## 2018-02-01 MED ORDER — MORPHINE 100MG IN NS 100ML (1MG/ML) PREMIX INFUSION
0.0000 mg/h | INTRAVENOUS | Status: DC
  Administered 2018-02-01 (×2): 10 mg/h via INTRAVENOUS
  Filled 2018-02-01 (×2): qty 100

## 2018-02-01 MED ORDER — MORPHINE BOLUS VIA INFUSION
5.0000 mg | INTRAVENOUS | Status: DC | PRN
  Administered 2018-02-01: 10 mg via INTRAVENOUS
  Filled 2018-02-01: qty 20

## 2018-02-01 MED ORDER — MORPHINE BOLUS VIA INFUSION
2.0000 mg | INTRAVENOUS | Status: DC | PRN
  Filled 2018-02-01: qty 4

## 2018-02-02 LAB — CALCIUM, IONIZED: Calcium, Ionized, Serum: 4.6 mg/dL (ref 4.5–5.6)

## 2018-02-02 NOTE — Progress Notes (Signed)
Morphine 82cc approx. Wasted- pt. Expired; witnessed by [facility].[place],RN.

## 2018-02-08 ENCOUNTER — Telehealth: Payer: Self-pay

## 2018-02-08 NOTE — Telephone Encounter (Signed)
Received d/c from [place]. D/c is for burial.  Patient of Doctor Elsworth Soho.   D/C will be taken to Pulmonary Unit for signature.  On 02/09/18 I received the d/c back from Doctor Elsworth Soho.  I got the d/c ready and called the funeral home to let them know the d/c is ready for pickup.

## 2018-02-09 NOTE — Social Work (Signed)
CSW acknowledging comfort care, will follow for disposition as appropriate.   Alexander Mt, [place] Work [phone]

## 2018-02-09 NOTE — Progress Notes (Signed)
Pt terminally extubated per MD order. RT and this RN at the bedside. No complications noted. Family called to bedside and chaplain called for assistance.

## 2018-02-09 NOTE — Plan of Care (Signed)
  Problem: Nutrition: Goal: Adequate nutrition will be maintained Outcome: Progressing   Problem: Safety: Goal: Ability to remain free from injury will improve Outcome: Progressing   Problem: Skin Integrity: Goal: Risk for impaired skin integrity will decrease Outcome: Progressing   Problem: Education: Goal: Knowledge of General Education information will improve Description Including pain rating scale, medication(s)/side effects and non-pharmacologic comfort measures Outcome: Not Progressing   Problem: Health Behavior/Discharge Planning: Goal: Ability to manage health-related needs will improve Outcome: Not Progressing   Problem: Clinical Measurements: Goal: Ability to maintain clinical measurements within normal limits will improve Outcome: Not Progressing Goal: Will remain free from infection Outcome: Not Progressing Goal: Diagnostic test results will improve Outcome: Not Progressing Goal: Respiratory complications will improve Outcome: Not Progressing   Problem: Activity: Goal: Risk for activity intolerance will decrease Outcome: Not Progressing

## 2018-02-09 NOTE — Progress Notes (Signed)
   February 05, 2018 1100  Clinical Encounter Type  Visited With Patient and family together  Visit Type Initial;Patient actively dying  Spiritual Encounters  Spiritual Needs Grief support  Responded to page for withdraw of care for Pt. Met patient daughter and wife, then other family members came in. Pt. Unresponsive and I shared grief support with wife. Family will withdraw vent shortly. I told wife I will check back later or they can ask for me. Chaplain Matthew Folks [phone]

## 2018-02-09 NOTE — Progress Notes (Signed)
Pt transferred to [facility], report given to [place], Therapist, sports. Family notified and escorted to new room 6N11.

## 2018-02-09 NOTE — Progress Notes (Signed)
Nutrition Brief Note  Chart reviewed. Pt now transitioning to comfort care.  No further nutrition interventions warranted at this time.  Please re-consult as needed.   Debby Clyne A. Yocelyn Brocious, RD, LDN, CDE Pager: [phone] After hours Pager: [phone]  

## 2018-02-09 NOTE — Progress Notes (Signed)
Pt found with OG tube shifted and pt vomiting approx 576ml of tube feeds. Tube replaced and KUB ordered to verify placement.

## 2018-02-09 NOTE — Progress Notes (Signed)
Met pt's 4 daughters and pt's wife (step-mother to the daughters) in waiting room.  They said they are from [place], [place], had been there for several days and couldn't afford hotel room, asked if anything was available.  Told them I would have to call someone else to inquire and would report bk to the family in the [facility] waiting room.  Let them know it could take a few hours between getting information and attending to emergency pages.    Chaplain paged administrative chaplain Matt S around 1am, he called bk w/in about 5-10 minutes.  [facility] was an option, but is first come, first served, and can only be reserved during business hours.  Went back to family to report what I learned, let them know that the administrative person in [place] would not be available until 8:30/9am on Fri, Sept 20 to check into availability and booking.  I also listened compassionately.  Let Spiritual Wellness administrative person know to f/u w/ family (pt in [place]) around [place] resident, [phone]

## 2018-02-09 NOTE — Progress Notes (Signed)
Progress Note  Patient Name: Andrew Lewis Date of Encounter: 02/16/2018  Primary Cardiologist: No primary care provider on file.   Subjective   Intubated, unresponsive  Inpatient Medications    Scheduled Meds: . aspirin  81 mg Per Tube Daily  . chlorhexidine gluconate (MEDLINE KIT)  15 mL Mouth Rinse BID  . Chlorhexidine Gluconate Cloth  6 each Topical Daily  . famotidine  20 mg Per Tube Daily  . feeding supplement (PRO-STAT SUGAR FREE 64)  30 mL Per Tube TID  . free water  200 mL Per Tube Q6H  . heparin  5,000 Units Subcutaneous Q8H  . insulin aspart  0-9 Units Subcutaneous Q4H  . mouth rinse  15 mL Mouth Rinse 10 times per day  . valproic acid  500 mg Per Tube BID   Continuous Infusions: . sodium chloride Stopped (01/31/18 1620)  . sodium chloride    . sodium chloride Stopped (01/31/18 2230)  . ampicillin-sulbactam (UNASYN) IV Stopped (01/31/18 2112)  . feeding supplement (VITAL AF 1.2 CAL) 1,000 mL (01/31/18 2222)  . norepinephrine (LEVOPHED) Adult infusion Stopped (01/30/18 1136)   PRN Meds: Place/Maintain arterial line **AND** sodium chloride, sodium chloride, pneumococcal 23 valent vaccine, sodium chloride flush   Vital Signs    Vitals:   02-16-2018 0500 Feb 16, 2018 0600 02-16-2018 0700 Feb 16, 2018 0748  BP: 116/63 123/69 119/72 119/72  Pulse: 91 90 91 (!) 109  Resp: _0 Temp: (!) 92.7 F (33.7 C) 98.1 F (36.7 C) 98.6 F (37 C)   TempSrc:      SpO2: 100% 100% 100% 100%  Weight:      Height:        Intake/Output Summary (Last 24 hours) at 16-Feb-2018 0820 Last data filed at 16-Feb-2018 0647 Gross per 24 hour  Intake 1840.41 ml  Output 1040 ml  Net 800.41 ml   Filed Weights   01/30/18 0500 01/31/18 0500 2018-02-16 0418  Weight: 94.1 kg 90.8 kg 89.5 kg    Telemetry    Sinus rhythm/sinus tach - Personally Reviewed   Physical Exam  Intubated, unresponsive GEN: No acute distress.   Neck: No JVD Cardiac: tachy regular, distant heart sounds, no  murmurs, rubs, or gallops.  Respiratory: diffuse rales and rhonchi GI: Soft, non-distended  MS: 1+ p[retibial edema; No deformity. Neuro:  unresponsive  Labs    Chemistry Recent Labs  Lab 01/27/18 1629 01/28/18 0506 01/29/18 0417 01/30/18 0542 01/31/18 1606  NA 139 138 139 141 140  K 3.9 5.0 3.9 3.9 3.9  CL 104 102 103 104 103  CO2 _1 GLUCOSE 160* 158* 165* 163* 155*  BUN 56* 62* 66* 77* 96*  CREATININE 3.33* 3.41* 3.23* 3.16* 3.36*  CALCIUM 6.9* 6.8* 7.4* 7.7* 8.1*  PROT 5.3* 5.3*  --  5.5*  --   ALBUMIN 1.6* 1.6*  --  1.6*  --   AST 176* 140*  --  54*  --   ALT 111* 101*  --  50*  --   ALKPHOS 131* 138*  --  184*  --   BILITOT 1.0 1.3*  --  1.1  --   GFRNONAA 17* 17* 18* 18* 17*  GFRAA 20* 19* 21* 21* 20*  ANIONGAP _2 Hematology Recent Labs  Lab 01/27/18 0431 01/29/18 0417  WBC 5.5 4.2  RBC 4.04* 3.82*  HGB 10.4* 9.8*  HCT 32.2* 30.2*  MCV 79.7 79.1  MCH 25.7* 25.7*  MCHC 32.3 32.5  RDW 16.5* 16.9*  PLT 136* 105*    Cardiac Enzymes Recent Labs  Lab 01/25/18 1053  TROPONINI 1.29*   No results for input(s): TROPIPOC in the last 168 hours.   BNPNo results for input(s): BNP, PROBNP in the last 168 hours.   DDimer No results for input(s): DDIMER in the last 168 hours.   Radiology    No results found.   Patient Profile     72 y.o. male with known NICM with LVEF=10% by echo 01/21/18, CKD, prior PE on  Coumadin, HTN, HLD admitted following out of hospital VF arrest. Pt is followed by cardiology in [place], [place] and has refused ICD in the past.   Assessment & Plan    72 year old male with history of nonischemic cardiomyopathy who suffered ventricular fibrillation cardiac arrest now complicated by severe anoxic encephalopathy.  Patient remains unresponsive.  Extensive hospital records reviewed, specifically neurologic evaluation suggestive of a very poor neurologic prognosis.  Family discussions documented.  No further cardiac  evaluation indicated at this time.  We will sign off today.  Please call if any questions.  [facility] will sign off.   Medication Recommendations:  none Other recommendations (labs, testing, etc):  none Follow up as an outpatient:  none  For questions or updates, please contact [facility] Please consult [email or web address] for contact info under        Signed, Sherren Mocha, MD  13-Feb-2018, 8:20 AM

## 2018-02-09 NOTE — Progress Notes (Signed)
I visited with the patient and family prior to the removal of the vent apparatus. I provided spiritual support to the family by pastoral presence and leading in prayer. I shared that the Chaplain is available for additional support as needed or requested.     02/19/18 1200  Clinical Encounter Type  Visited With Patient and family together  Visit Type Follow-up;Spiritual support  Referral From Nurse  Consult/Referral To Chaplain  Spiritual Encounters  Spiritual Needs Prayer;Grief support  Stress Factors  Patient Stress Factors None identified  Family Stress Factors Exhausted    Chaplain Dr Redgie Grayer

## 2018-02-09 NOTE — Discharge Summary (Signed)
72 year old diabetic, hypertensive admitted 9/15 following V. fib arrest He underwent hypothermia protocol and developed seizures on rewarming.  Shock resolved after 3 days, but AKI persisted. He remained comatose, on mechanical ventilation in spite of being off sedation for 3 days.  Significant tests/ events reviewed  9/16 patient undergoing TTM protocol at [facility]. Started last night. Left IJ catheter inserted for levophend to maintain MAP > 54mmHg.  9/17: Temperature management protocol discontinued at 7 AM with neuromuscular blockade stopped.  Lip twitching and eye movement worrisome for possible status.  Stat EEG and neurology consult requested.  Remains in shock with escalating pressor requirements, worsening acidosis, and worsening endorgan function.  Pancultured.  Empiric Unasyn started.  Bicarbonate infusion started for non-gap component of acidosis.  EEG showed: GPEDs started on Depakote, seizure activity did not resolve, placed on continuous EEG monitoring Versed titrated up 9/18: EEG still showing semi-periodic epileptiform discharges in bilateral frontotemporal region, but no seizure.  Versed infusion continued.  Still in shock on vasoactive drips.  Renal function worse.  With wife, and 3 daughters.  Goals of care discussed.  May DO NOT RESUSCITATE.  Decided against dialysis.  Goal is to continue supportive care with and watch for neurological recovery over the next several days and if no improvement likely transition to palliative approach 9/19: Remains unresponsive off from all sedating drips 9/20: Levophed drip discontinued remains unresponsive no sedation for over 24 hours 9/21: remains unresponsive. 9/22. No change in level of consciousness.   Consults: date of consult/date signed off & final recs:  Cardiology 9/15>>> Neurology  9/17>>9/23 (poor prognosis)   Procedures (surgical and bedside):  Intubation: 9/15 CVC: 9/15  Significant Diagnostic Tests:  CT Head: 9/15: neg EEG  9/16: burst suppression. No seizure  Echocardiogram 9/16:Ejection fraction 20 to 25% cavity mildly dilated systolic function severely reduced EEG 9/17: GPEDs Renal ultrasound 9/17>>> medical renal disease no hydro-nephrosis.  There is a 2.3 cm lesion on left kidney upper pole could represent cyst FENa: 0.33 EEG 9/18: Severe slowing.  Semi-periodic epileptiform discharges from the bilateral frontotemporal region seizures   Goals of care discussion with family, they opted for withdrawal of life support.  Cause of death - Anoxic encephalopathy, cardiomyopathy, acute respiratory failure  Rakesh V. Elsworth Soho MD

## 2018-02-09 NOTE — Progress Notes (Signed)
NAME:  Andrew Lewis, MRN:  [id number], DOB:  Feb 06, 1946, LOS: 8 ADMISSION DATE:  01/26/2018, CONSULTATION DATE:  02/06/2018 REFERRING MD:  A. Zenia Resides (ED), CHIEF COMPLAINT:  S/P V-fib arrest   Brief History   Andrew Lewis is a 72 y.o. B M brought to [facility] ED following a V-fib arrest. Per Andrew Lewis's wife, Andrew were en route back to their home in [place], [place] from [place] and stopped at [employer] in [place]. After eating and getting back into their car, Andrew Lewis suddenly became unresponsive without a prior complaint of chest pain, SOB or noticeable lateralizing signs or symptoms. EMS was called, arrived within 15 min and found Andrew Lewis apneic and pulseless in V-fib arrest. He received shock x 3, epi x 2 and arrived on Epi drip; Amiodarone 300mg  was also given prior to arrival. He reportedly had intermittent pulses during Andrew 30 min transport to [facility] with approximate down time to initial ROSC of 7 min. ECG post arrival without evidence of STEMI and TnI was 0.03.  Andrew wife states that Andrew Lewis has a hx HTN, DM and an arrhythmia in Andrew past, for which he was on anticoagulants. He does see a cardiologist in [place]. He also has a hx OSA and uses CPAP Significant Hospital Events   9/16 Lewis undergoing TTM protocol at [facility]. Started last night. Left IJ catheter inserted for levophend to maintain MAP > 29mmHg.  9/17: Temperature management protocol discontinued at 7 AM with neuromuscular blockade stopped.  Lip twitching and eye movement worrisome for possible status.  Stat EEG and neurology consult requested.  Remains in shock with escalating pressor requirements, worsening acidosis, and worsening endorgan function.  Pancultured.  Empiric Unasyn started.  Bicarbonate infusion started for non-gap component of acidosis.  EEG showed: GPEDs started on Depakote, seizure activity did not resolve, placed on continuous EEG monitoring Versed titrated up 9/18: EEG still showing semi-periodic epileptiform  discharges in bilateral frontotemporal region, but no seizure.  Versed infusion continued.  Still in shock on vasoactive drips.  Renal function worse.  With wife, and 3 daughters.  Goals of care discussed.  May DO NOT RESUSCITATE.  Decided against dialysis.  Goal is to continue supportive care with and watch for neurological recovery over Andrew next several days and if no improvement likely transition to palliative approach 9/19: Remains unresponsive off from all sedating drips 9/20: Levophed drip discontinued remains unresponsive no sedation for over 24 hours 9/21: remains unresponsive. 9/22. No change in level of consciousness.  Consults: date of consult/date signed off & final recs:  Cardiology 9/15>>> Neurology  9/17>>9/23 (poor prognosis)   Procedures (surgical and bedside):  Intubation: 9/15 CVC: 9/15  Significant Diagnostic Tests:  CT Head: 9/15: neg EEG 9/16: burst suppression. No seizure  Echocardiogram 9/16:Ejection fraction 20 to 25% cavity mildly dilated systolic function severely reduced EEG 9/17: GPEDs Renal ultrasound 9/17>>> medical renal disease no hydro-nephrosis.  There is a 2.3 cm lesion on left kidney upper pole could represent cyst FENa: 0.33 EEG 9/18: Severe slowing.  Semi-periodic epileptiform discharges from Andrew bilateral frontotemporal region seizures MRI 9/22>>>  Micro Data: Blood culture times two 9/17 Urine culture 9/17 Sputum culture 9/17: Normal flora  Antimicrobials:  Unasyn 9/17>>>9/22  Subjective:  Unresponsive.  Does breathe spontaneously over vent rate  No gag   Objective   Blood pressure 119/72, pulse (!) 109, temperature 98.6 F (37 C), resp. rate 16, height 5\' 9"  (1.753 m), weight 89.5 kg, SpO2 100 %. CVP:  [25 mmHg]  25 mmHg  Vent Mode: PRVC FiO2 (%):  [30 %] 30 % Set Rate:  [12 bmp] 12 bmp Vt Set:  [570 mL] 570 mL PEEP:  [5 cmH20] 5 cmH20 Plateau Pressure:  [9 cmH20-24 cmH20] 18 cmH20   Intake/Output Summary (Last 24 hours) at  02-23-18 1000 Last data filed at February 23, 2018 5732 Gross per 24 hour  Intake 1613.37 ml  Output 940 ml  Net 673.37 ml   Filed Weights   01/30/18 0500 01/31/18 0500 February 23, 2018 0418  Weight: 94.1 kg 90.8 kg 89.5 kg    Examination: General:  Chronically ill appearing male, unresponsive on vent HEENT: MM pink/moist, ETT Neuro: unresponsive, NO gag, does breathe over vent rate, NO cough to suction, off all sedation >72hrs, leftward gaze  CV: s1s2 rrr, no m/r/g PULM: even/non-labored, lungs bilaterally coarse KG:URKY, non-tender, bsx4 active  Extremities: warm/dry, generalized edema  Skin: no rashes or lesions   Resolved/inactive Hospital Problem list   Obstructive sleep apnea Right lower extremity externally rotated (not a candidate for further evaluation given neurological insult) Mixed anion gap and non-anion gap metabolic acidosis (resolved 9/19) Lactic acidosis Cardiogenic shock resolved as of 9/20 Assessment & Plan:   S/P V-fib arrest in setting of known nonischemic cardiomyopathy.  He has an ejection fraction 10%   Plan Keep euvolemic Continue telemetry monitoring Doubtful ischemic evaluation will be warranted given poor neurologic prognosis    Acute hypoxic respiratory failure status post cardiac arrest, with right-sided pulmonary infiltrate and pleural effusion  Plan Vent support - 8cc/kg  F/u CXR  F/u ABG PRN cCXR  VAP bundle  S/p 5 days of Unasyn Needs trach if family wishes to continue aggressive care  Poor neurologic prognosis - see below   Acute metabolic encephalopathy, presumed hypoxic encephalopathy. Seizure activity/GPEDs -No further seizures as of 9/19 -Unresponsive, sedations been off since 9/19 Plan PAD protocol, RASS goal 0 Neuro signed off - poor prognosis  Avoid fever  No change in valproic acid   Acute on chronic renal failure  Plan F/u chem  Not a candidate for dialysis  Monitor UOP    Type II DM. Plan Continue SSI   Mild  anemia without evidence of bleeding Mild thrombocytopenia  Plan F/u CBC     Disposition / Summary of Today's Plan 2018-02-23   Discussing today with family. Would recommend withdrawal and transition to comfort    Diet: enteral feeding. Pain/Anxiety/Delirium protocol (if indicated): none needed  VAP protocol (if indicated): Ordered DVT prophylaxis: sub q heparin GI prophylaxis: H2 blocker Hyperglycemia protocol: s/s insulin Mobility:bedbound Code Status: He is now full DO NOT RESUSCITATE, no escalation.  Further family discussion 9/23 with Dr Elsworth Soho pending    Labs   CBC: Recent Labs  Lab 01/27/18 0431 01/29/18 0417  WBC 5.5 4.2  NEUTROABS 3.1 3.4  HGB 10.4* 9.8*  HCT 32.2* 30.2*  MCV 79.7 79.1  PLT 136* 706*   Basic Metabolic Panel: Recent Labs  Lab 01/26/18 0915  01/26/18 1540 01/27/18 0431  01/27/18 1629 01/28/18 0506 01/29/18 0417 01/30/18 0542 01/31/18 1606  NA  --    < >  --  139   < > 139 138 139 141 140  K  --    < >  --  4.3   < > 3.9 5.0 3.9 3.9 3.9  CL  --    < >  --  107   < > 104 102 103 104 103  CO2  --    < >  --  19*   < > 22 24 25 26 24   GLUCOSE  --    < >  --  114*   < > 160* 158* 165* 163* 155*  BUN  --    < >  --  51*   < > 56* 62* 66* 77* 96*  CREATININE  --    < >  --  3.03*   < > 3.33* 3.41* 3.23* 3.16* 3.36*  CALCIUM  --    < >  --  6.7*   < > 6.9* 6.8* 7.4* 7.7* 8.1*  MG 1.9  --  1.8 1.7  --  2.1  --   --   --  2.1  PHOS 4.3  --  4.3 4.1  --  3.9  --   --   --  4.4   < > = values in this interval not displayed.   GFR: Estimated Creatinine Clearance: 22 mL/min (A) (by C-G formula based on SCr of 3.36 mg/dL (H)). Recent Labs  Lab 01/26/18 0932 01/27/18 0431 01/27/18 1235 01/29/18 0417  WBC  --  5.5  --  4.2  LATICACIDVEN 3.4*  --  1.8  --    Liver Function Tests: Recent Labs  Lab 01/27/18 0431 01/27/18 1235 01/27/18 1629 01/28/18 0506 01/30/18 0542  AST 180* 177* 176* 140* 54*  ALT 92* 104* 111* 101* 50*  ALKPHOS 130* 124  131* 138* 184*  BILITOT 1.0 1.1 1.0 1.3* 1.1  PROT 5.1* 5.1* 5.3* 5.3* 5.5*  ALBUMIN 1.7* 1.6* 1.6* 1.6* 1.6*   No results for input(s): LIPASE, AMYLASE in Andrew last 168 hours. No results for input(s): AMMONIA in Andrew last 168 hours. ABG    Component Value Date/Time   PHART 7.439 01/28/2018 0612   PCO2ART 43.2 01/28/2018 0612   PO2ART 119.0 (H) 01/28/2018 0612   HCO3 29.4 (H) 01/28/2018 0612   TCO2 31 01/28/2018 0612   ACIDBASEDEF 1.0 01/27/2018 1641   O2SAT 99.0 01/28/2018 0612    Coagulation Profile: No results for input(s): INR, PROTIME in Andrew last 168 hours. Cardiac Enzymes: Recent Labs  Lab 01/25/18 1053  TROPONINI 1.29*   HbA1C: No results found for: HGBA1C CBG: Recent Labs  Lab 01/31/18 1607 01/31/18 2019 01/31/18 2337 15-Feb-2018 0332 02/15/2018 [place], NP 15-Feb-2018  10:00 AM Pager: [phone] or [phone]

## 2018-02-09 NOTE — Progress Notes (Signed)
Pt extubated per withdrawal order.  RN at bedside. 

## 2018-02-09 DEATH — deceased

## 2019-02-28 IMAGING — DX DG ABDOMEN 1V
1 series · 1 of 1 positions shown · non-contrast
Comparison: None.

CLINICAL DATA: OG tube placement.

EXAM:
ABDOMEN - 1 VIEW

[abdomen]
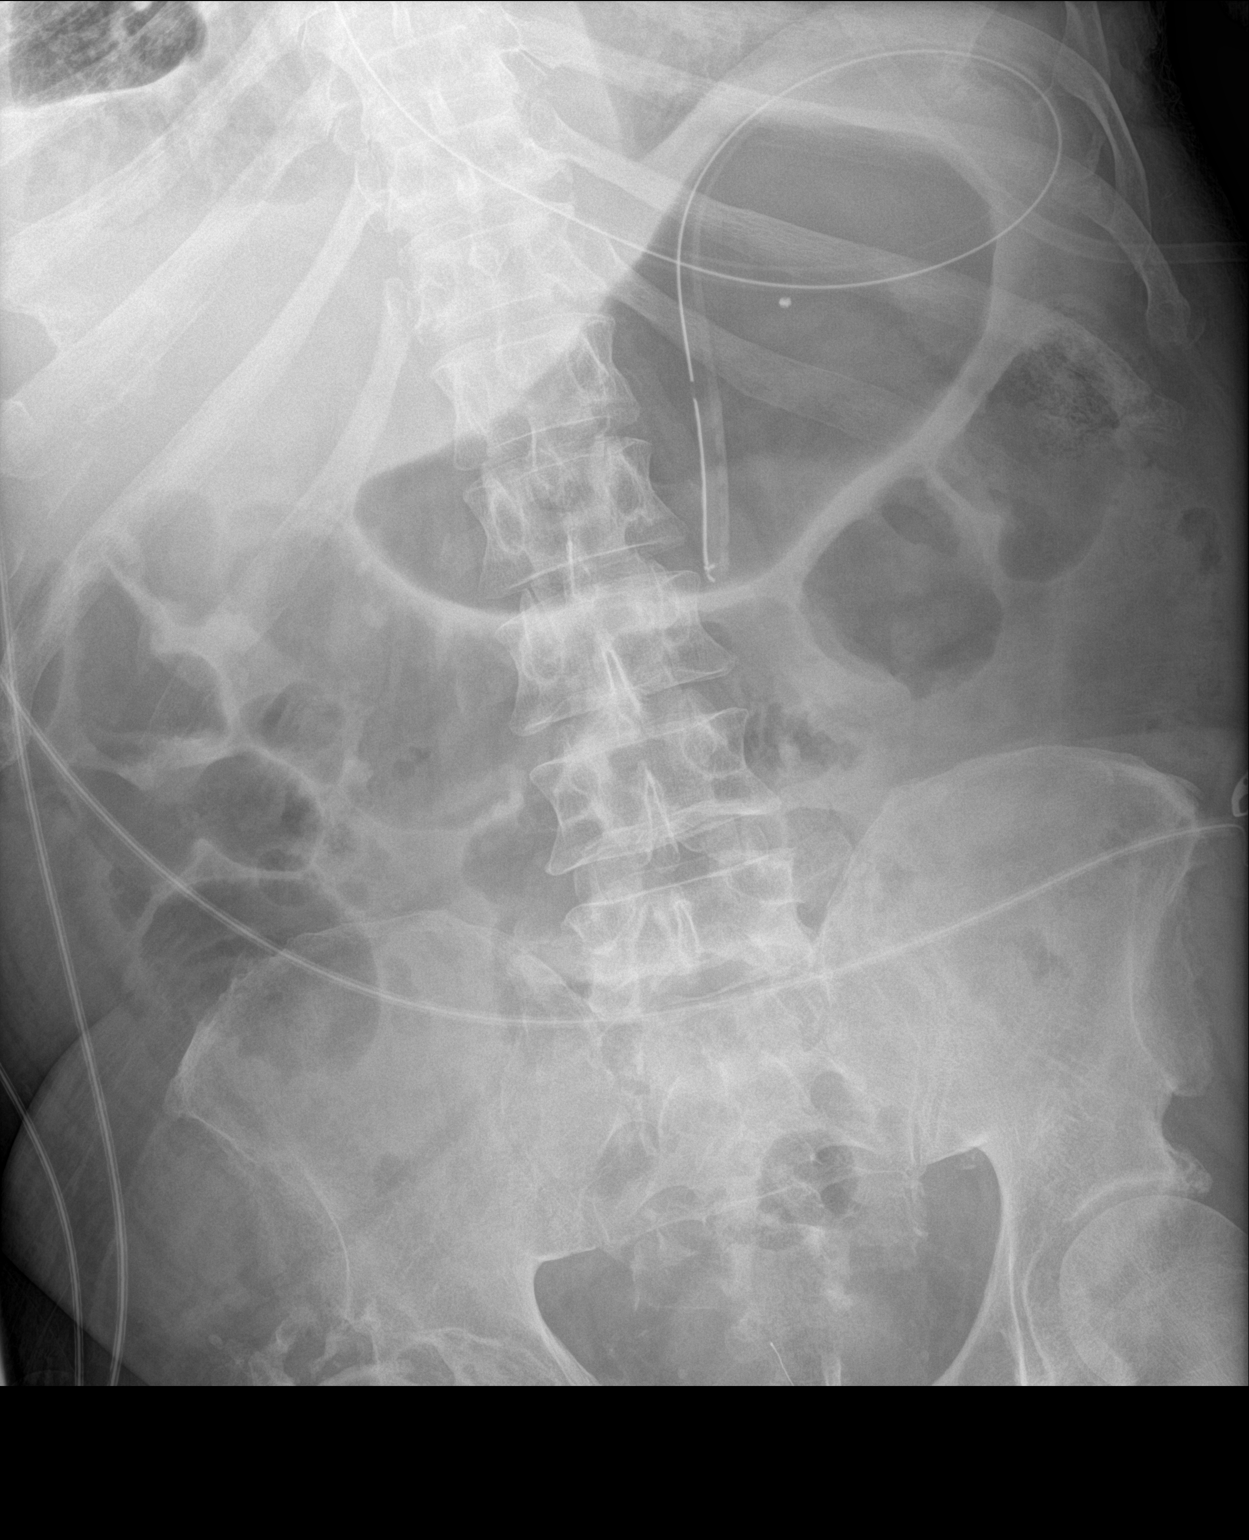

[1 of 1 positions shown; findings below may reference images not displayed]

FINDINGS: Enteric tube is looped in the proximal stomach with tip projecting
over the mid to distal gastric body. No dilated loops of bowel are
seen to suggest obstruction. Partial imaging of the right hip
demonstrates absence of the femoral head with surrounding
heterotopic ossification, chronic based on radiology reports in [REDACTED] which describe a right hip arthroplasty and subsequent
prosthesis removal.
IMPRESSION: Enteric tube in the stomach.

## 2019-08-21 IMAGING — US US RENAL
1 series · 14 of 25 positions shown · non-contrast
Comparison: None.

CLINICAL DATA: Acute renal failure, chronic kidney disease

EXAM:
RENAL / URINARY TRACT ULTRASOUND COMPLETE

[Series 1: us renal · 0.20mm/px · 14 of 31 slices shown]
[im 1/31]
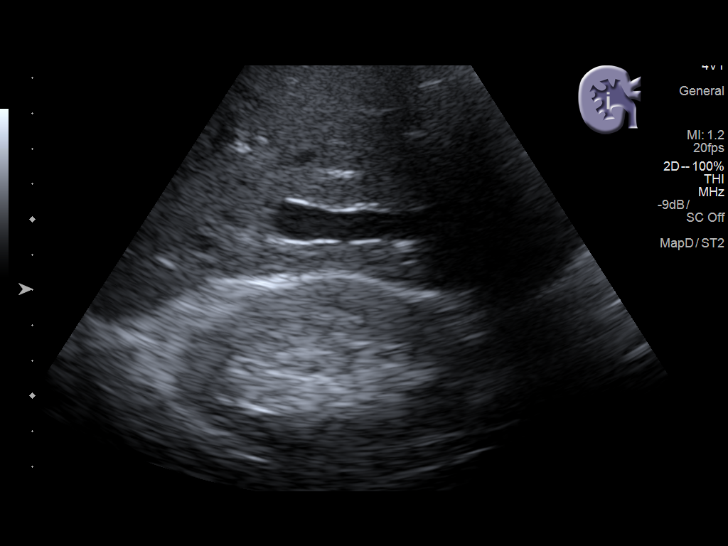
[im 3/31]
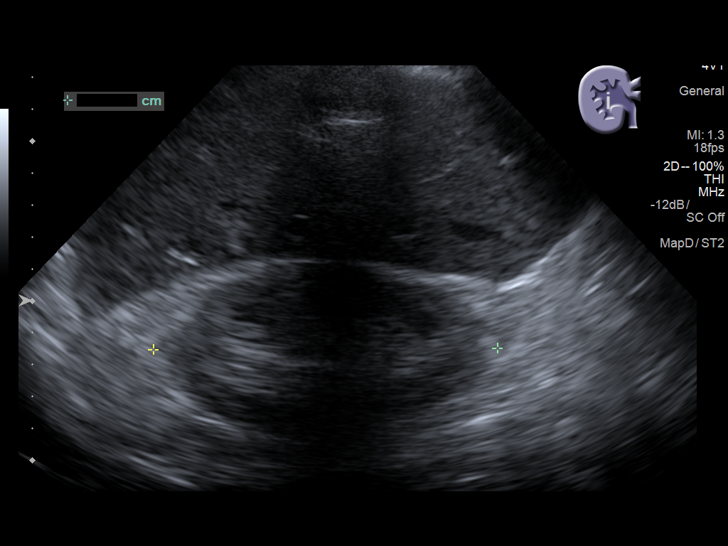
[im 6/31]
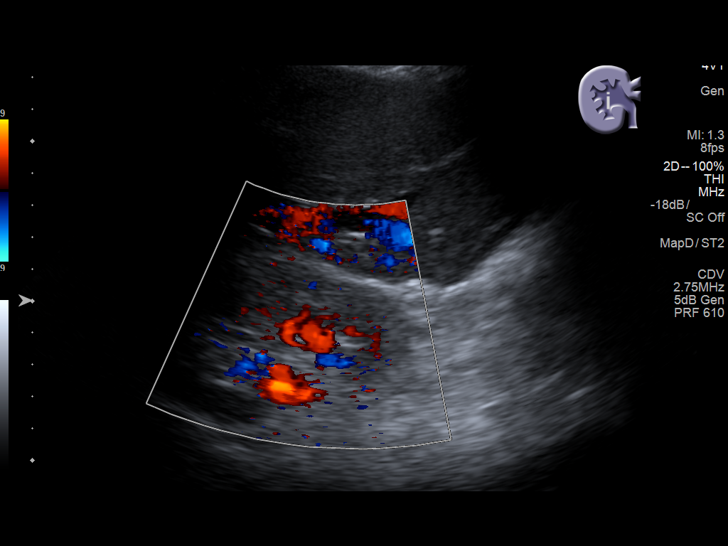
[im 8/31]
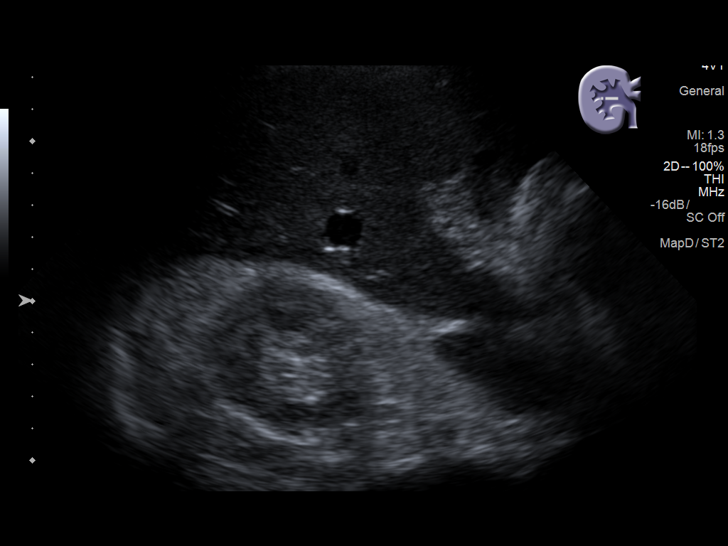
[im 11/31]
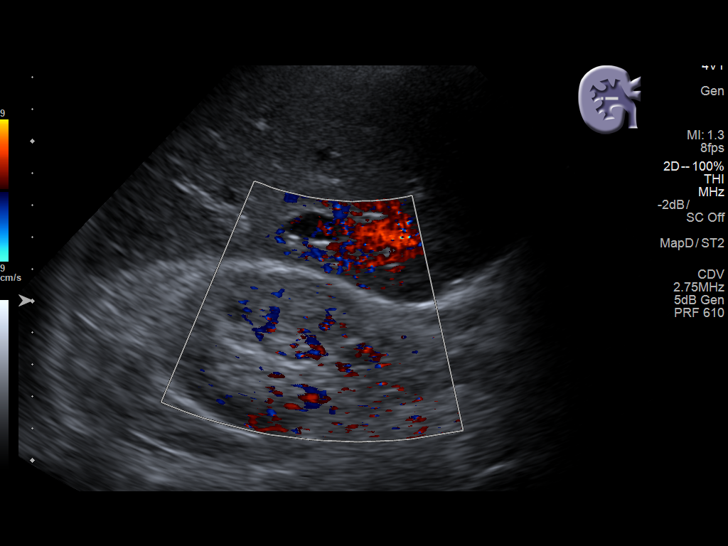
[im 12/31]
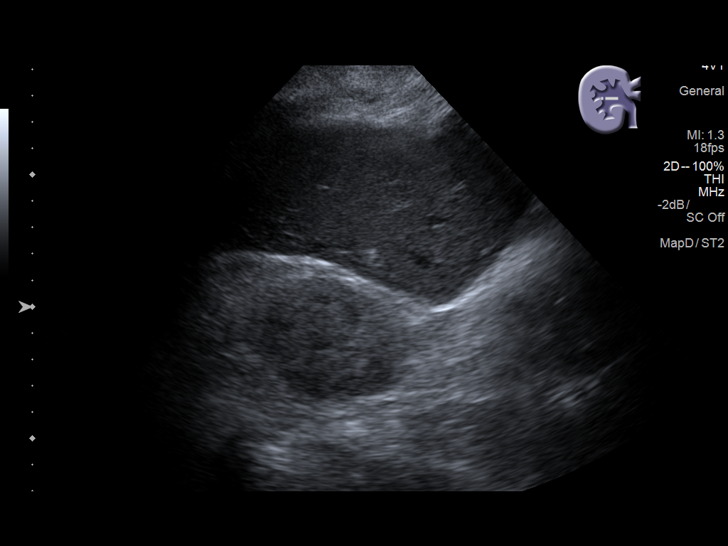
[im 14/31]
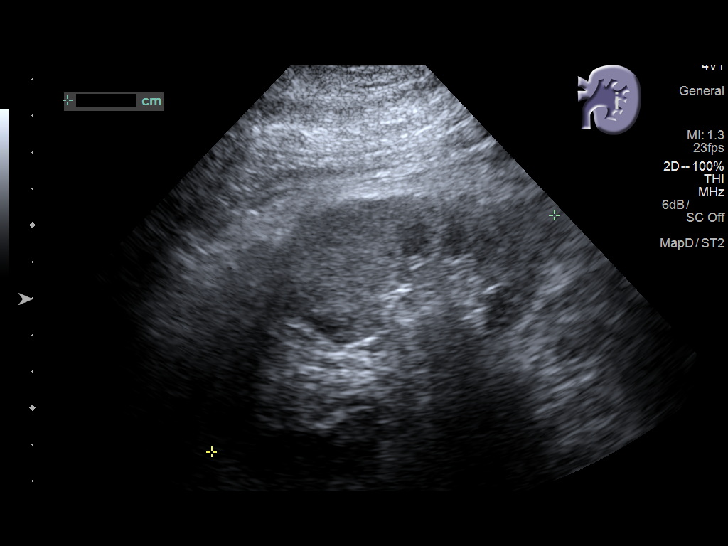
[im 17/31]
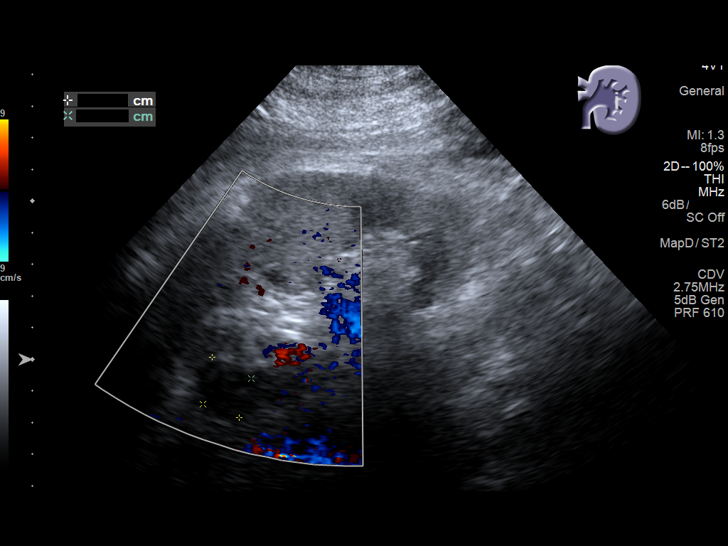
[im 19/31]
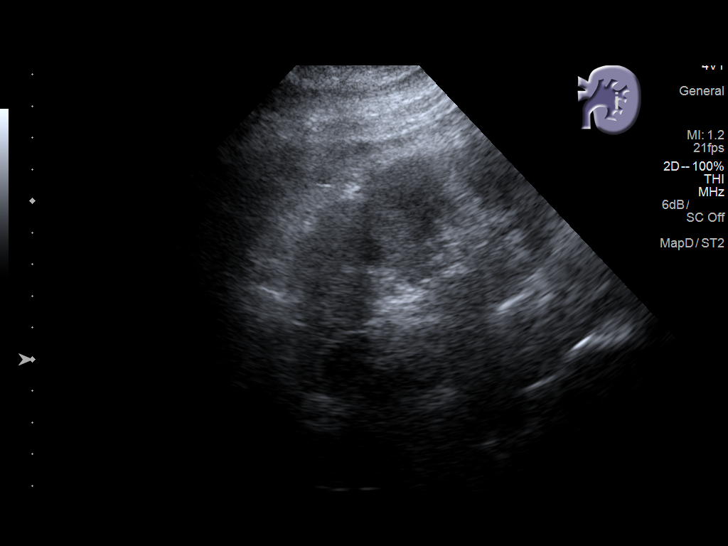
[im 21/31]
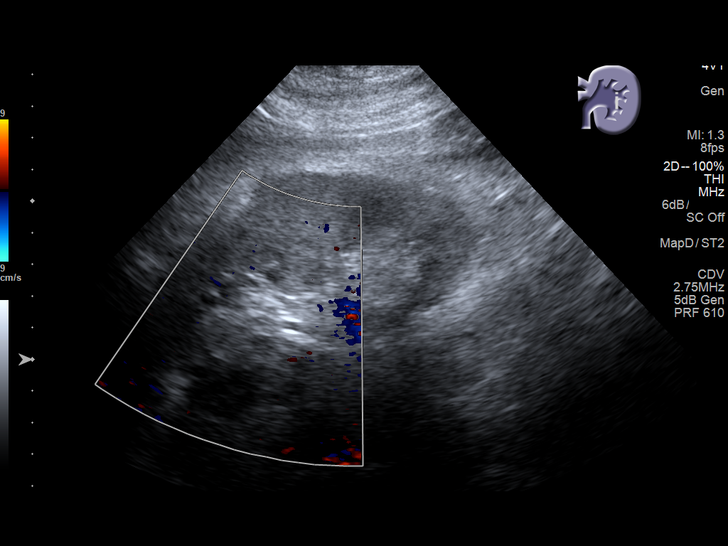
[im 23/31]
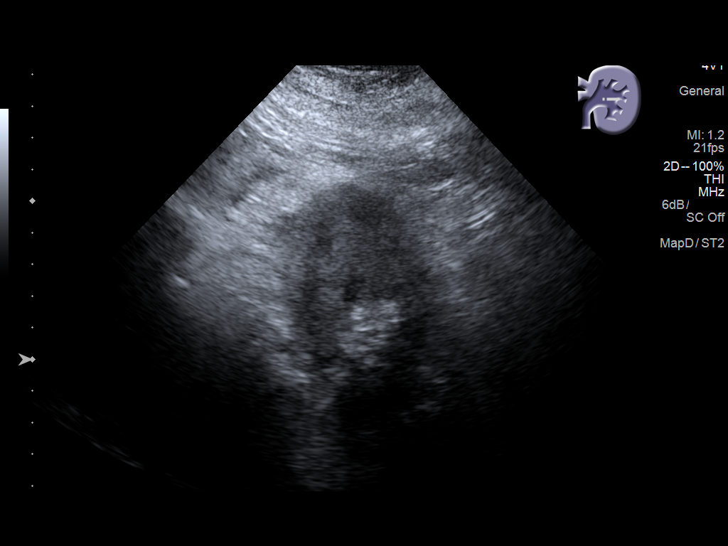
[im 26/31]
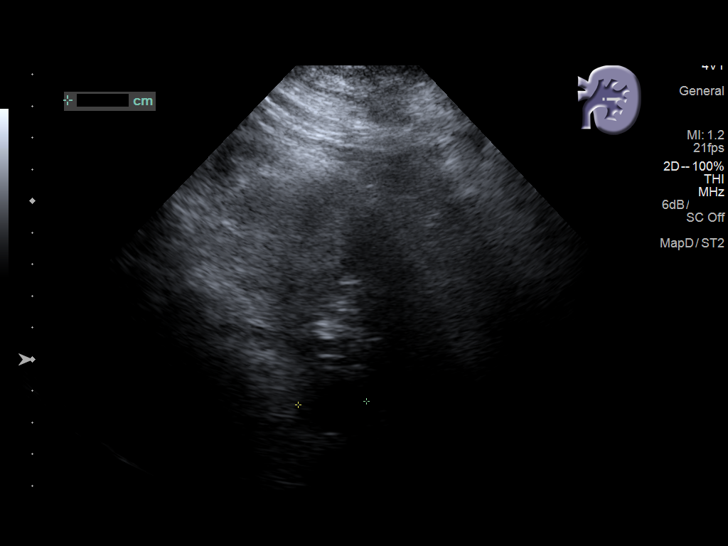
[im 28/31]
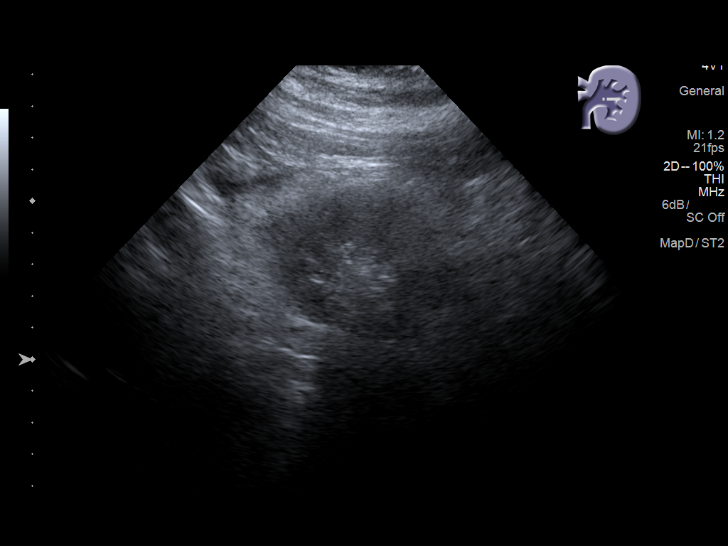
[im 31/31]
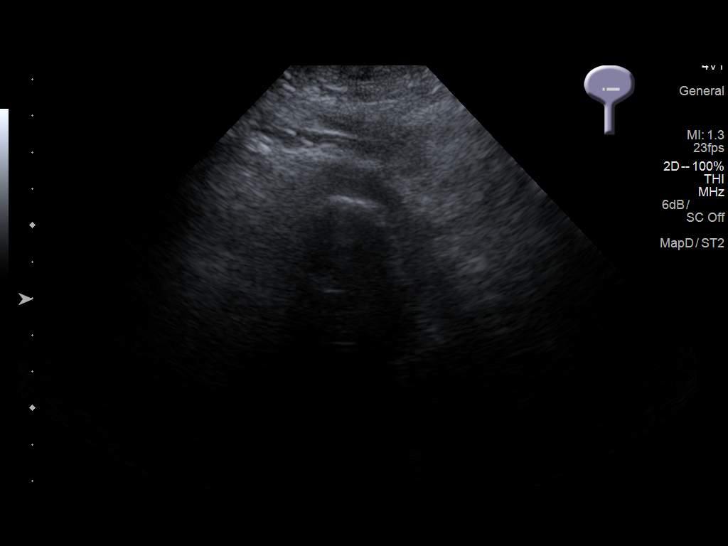

[14 of 25 positions shown; findings below may reference images not displayed]

FINDINGS: Right Kidney:

Length: 10.8 cm. Increased cortical echogenicity. No hydronephrosis.

Left Kidney:

Length: Increased cortical echogenicity. No hydronephrosis. Upper
pole hypoechoic lesion measuring 2.3 x 1.7 x 2.1 cm, difficult
evaluation due to posterior location.. Echogenicity within normal
limits. No mass or hydronephrosis visualized.

Bladder:

Foley catheter in the bladder.
IMPRESSION: 1. Increased cortical echogenicity suggesting medical renal disease,
no hydronephrosis.
2. Hypoechoic lesion upper pole left kidney measuring 2.3 cm,
possible complicated cyst but poorly visualized. When clinically
feasible, could further evaluate with dedicated renal CT or MRI.
